# Patient Record
Sex: Female | Born: 1990
Health system: Southern US, Community
[De-identification: ages and names within clinical notes are randomized; demographics above are authoritative.]

## PROBLEM LIST (undated history)

## (undated) DIAGNOSIS — I493 Ventricular premature depolarization: Secondary | ICD-10-CM

## (undated) DIAGNOSIS — Z87898 Personal history of other specified conditions: Secondary | ICD-10-CM

## (undated) DIAGNOSIS — J45909 Unspecified asthma, uncomplicated: Secondary | ICD-10-CM

## (undated) DIAGNOSIS — I499 Cardiac arrhythmia, unspecified: Secondary | ICD-10-CM

## (undated) DIAGNOSIS — G43909 Migraine, unspecified, not intractable, without status migrainosus: Secondary | ICD-10-CM

## (undated) HISTORY — DX: Ventricular premature depolarization: I49.3

## (undated) HISTORY — DX: Personal history of other specified conditions: Z87.898

## (undated) HISTORY — PX: INDUCED ABORTION: SHX677

## (undated) HISTORY — DX: Unspecified asthma, uncomplicated: J45.909

## (undated) HISTORY — DX: Migraine, unspecified, not intractable, without status migrainosus: G43.909

## (undated) HISTORY — PX: LYMPHADENECTOMY: SHX15

## (undated) HISTORY — DX: Cardiac arrhythmia, unspecified: I49.9

---
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
## 2009-02-10 ENCOUNTER — Emergency Department (HOSPITAL_COMMUNITY): Admission: EM | Admit: 2009-02-10 | Discharge: 2009-02-10 | Payer: Self-pay | Admitting: Emergency Medicine

## 2010-11-28 LAB — URINALYSIS, ROUTINE W REFLEX MICROSCOPIC
Bilirubin Urine: NEGATIVE
Glucose, UA: NEGATIVE
Ketones, ur: NEGATIVE
Nitrite: NEGATIVE
Protein, ur: NEGATIVE
pH: 6

## 2010-11-28 LAB — URINE MICROSCOPIC-ADD ON

## 2010-11-28 LAB — PREGNANCY, URINE

## 2011-07-25 ENCOUNTER — Encounter: Payer: Self-pay | Admitting: *Deleted

## 2011-07-25 DIAGNOSIS — J45909 Unspecified asthma, uncomplicated: Secondary | ICD-10-CM | POA: Insufficient documentation

## 2011-07-25 DIAGNOSIS — Z87898 Personal history of other specified conditions: Secondary | ICD-10-CM | POA: Insufficient documentation

## 2012-08-02 ENCOUNTER — Emergency Department (INDEPENDENT_AMBULATORY_CARE_PROVIDER_SITE_OTHER): Payer: 59

## 2012-08-02 ENCOUNTER — Emergency Department (HOSPITAL_COMMUNITY)
Admission: EM | Admit: 2012-08-02 | Discharge: 2012-08-02 | Disposition: A | Discharge status: Home / Self Care | Payer: 59 | Source: Home / Self Care | Attending: Emergency Medicine | Admitting: Emergency Medicine

## 2012-08-02 ENCOUNTER — Encounter (HOSPITAL_COMMUNITY): Payer: Self-pay | Admitting: Emergency Medicine

## 2012-08-02 DIAGNOSIS — S335XXA Sprain of ligaments of lumbar spine, initial encounter: Secondary | ICD-10-CM

## 2012-08-02 DIAGNOSIS — S39012A Strain of muscle, fascia and tendon of lower back, initial encounter: Secondary | ICD-10-CM

## 2012-08-02 LAB — POCT URINALYSIS DIP (DEVICE)
Bilirubin Urine: NEGATIVE
Ketones, ur: NEGATIVE mg/dL
Leukocytes, UA: NEGATIVE
Specific Gravity, Urine: >=1.03 (ref 1.005–1.030)

## 2012-08-02 LAB — POCT PREGNANCY, URINE: Preg Test, Ur: NEGATIVE

## 2012-08-02 MED ORDER — METHOCARBAMOL 500 MG PO TABS: 500.0000 mg | ORAL_TABLET | Freq: Three times a day (TID) | ORAL | Status: DC

## 2012-08-02 MED ORDER — TRAMADOL HCL 50 MG PO TABS: 100.0000 mg | ORAL_TABLET | Freq: Three times a day (TID) | ORAL | Status: DC | PRN

## 2012-08-02 MED ORDER — KETOROLAC TROMETHAMINE 60 MG/2ML IM SOLN
60.0000 mg | Freq: Once | INTRAMUSCULAR | Status: AC
  Administered 2012-08-02: 60 mg via INTRAMUSCULAR

## 2012-08-02 MED ORDER — MELOXICAM 15 MG PO TABS: 15.0000 mg | ORAL_TABLET | Freq: Every day | ORAL | Status: DC

## 2012-08-02 MED ORDER — KETOROLAC TROMETHAMINE 60 MG/2ML IM SOLN
INTRAMUSCULAR | Status: AC
  Filled 2012-08-02: qty 2
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
## 2014-05-21 ENCOUNTER — Telehealth: Payer: Self-pay

## 2014-05-21 ENCOUNTER — Other Ambulatory Visit: Payer: Self-pay

## 2014-05-21 DIAGNOSIS — R000 Tachycardia, unspecified: Secondary | ICD-10-CM

## 2014-05-21 NOTE — Telephone Encounter (Signed)
Patient called no answer.Left message on personal voice mail appointment scheduled with Dr.Jordan 06/18/14 at 4:15 pm.

## 2014-05-27 ENCOUNTER — Ambulatory Visit (HOSPITAL_COMMUNITY)
Admission: RE | Admit: 2014-05-27 | Discharge: 2014-05-27 | Disposition: A | Discharge status: Home / Self Care | Payer: BLUE CROSS/BLUE SHIELD | Source: Ambulatory Visit | Attending: Cardiovascular Disease | Admitting: Cardiovascular Disease

## 2014-05-27 DIAGNOSIS — Z8249 Family history of ischemic heart disease and other diseases of the circulatory system: Secondary | ICD-10-CM | POA: Diagnosis not present

## 2014-05-27 DIAGNOSIS — R000 Tachycardia, unspecified: Secondary | ICD-10-CM | POA: Insufficient documentation

## 2014-05-27 DIAGNOSIS — Z87898 Personal history of other specified conditions: Secondary | ICD-10-CM

## 2014-05-27 NOTE — Progress Notes (Signed)
2D Echocardiogram Complete.  05/27/2014   Patricia Rivera, [place]

## 2014-06-01 ENCOUNTER — Telehealth: Payer: Self-pay | Admitting: Cardiology

## 2014-06-01 NOTE — Telephone Encounter (Signed)
Received records from [facility] for appointment on 06/18/14 with Dr Martinique.  Records given to [facility] (medical records) for Dr Doug Sou schedule on 06/18/14. lp

## 2014-06-18 ENCOUNTER — Ambulatory Visit (INDEPENDENT_AMBULATORY_CARE_PROVIDER_SITE_OTHER): Payer: BLUE CROSS/BLUE SHIELD | Admitting: Cardiology

## 2014-06-18 ENCOUNTER — Encounter: Payer: Self-pay | Admitting: Cardiology

## 2014-06-18 VITALS — BP 130/90 | HR 98 | Ht 66.0 in | Wt 145.0 lb

## 2014-06-18 DIAGNOSIS — Z87898 Personal history of other specified conditions: Secondary | ICD-10-CM

## 2014-06-18 DIAGNOSIS — R002 Palpitations: Secondary | ICD-10-CM

## 2014-06-18 NOTE — Progress Notes (Signed)
Cardiology Office Note   Date:  06/18/2014   ID:  Patricia Rivera, DOB 03-Sep-1990, MRN [id number]  PCP:  Osborne Casco, MD  Cardiologist:   Parminder Trapani Martinique, MD   Chief Complaint  Patient presents with  . Tachycardia      History of Present Illness: Patricia Rivera is a 24 y.o. female who presents for evaluation of tachycardia. I saw her as a 24 yo for atypical chest pain as a 24 yo. Her cardiac exam and Ecg were normal and she was reassured. She still complains of some intermittent aching in her chest and one night awoke with a sensation of a "Charlie horse" in her left arm. More recently she has noted episodes when her HR is going fast- sometimes up to 130-140. This occurs almost every day and is random without clear triggers. It seems to gradually increase then decrease slowly. Lasts as long as 30 minutes. She wears a Fit bit and notes her HR will increase at work to 120-130. No dizziness or syncope. No dyspnea. Otherwise in good health. She did have panic attacks when her BCP was changed and she went back to her original Rx.     Past Medical History  Diagnosis Date  . H/O chest pain   . Childhood asthma     Past Surgical History  Procedure Laterality Date  . Other surgical history  2010    lymph node removal  . Lymphadenectomy       No current outpatient prescriptions on file.   No current facility-administered medications for this visit.    Allergies:   Review of patient's allergies indicates no known allergies.    Social History:  The patient  reports that she has been passively smoking.  She does not have any smokeless tobacco history on file. She reports that she drinks alcohol. She reports that she does not use illicit drugs.   Family History:  The patient's family history includes Heart attack in her mother.    ROS:  Please see the history of present illness.   Otherwise, review of systems are positive for none.   All other systems are reviewed and negative.     PHYSICAL EXAM: VS:  BP 130/90 mmHg  Pulse 98  Ht 5\' 6"  (1.676 m)  Wt 145 lb (65.772 kg)  BMI 23.41 kg/m2 , BMI Body mass index is 23.41 kg/(m^2). GEN: Young, Well nourished, well developed, in no acute distress HEENT: normal Neck: no JVD, carotid bruits, or masses Cardiac: RRR; no murmurs, rubs, or gallops,no edema  Respiratory:  clear to auscultation bilaterally, normal work of breathing GI: soft, nontender, nondistended, + BS MS: no deformity or atrophy Skin: warm and dry, no rash Neuro:  Strength and sensation are intact Psych: euthymic mood, full affect   EKG:  EKG is ordered today. The ekg ordered today demonstrates NSR with short PR of 104 msec. Otherwise normal. I have personally reviewed and interpreted this study.    Recent Labs: No results found for requested labs within last 365 days.    Lipid Panel No results found for: CHOL, TRIG, HDL, CHOLHDL, VLDL, LDLCALC, LDLDIRECT    Wt Readings from Last 3 Encounters:  06/18/14 145 lb (65.772 kg)      Other studies Reviewed: Additional studies/ records that were reviewed today include: reports from Dr. Laurann Montana. Review of the above records demonstrates: Normal Ecg, CBC, and TSH   ASSESSMENT AND PLAN:  1.  Tachycardia-need to rule out primary arrhythmia. I suspect this  is just sinus tachycardia. Will have her wear a Holter monitor to document. 2. Atypical chest pain. Normal Ecg and exam. No further work up needed.    Current medicines are reviewed at length with the patient today.  The patient does not have concerns regarding medicines.  The following changes have been made:  no change  Labs/ tests ordered today include:  Orders Placed This Encounter  Procedures  . Holter monitor - 48 hour  . EKG 12-Lead     Disposition:   FU with to be determined.  Signed, Imunique Samad Martinique, [place] 06/18/2014 5:37 PM    [facility] [address], [place], [place], [address] Phone [phone],  Fax [phone]

## 2014-06-18 NOTE — Patient Instructions (Signed)
We will have you wear a Holter monitor for 48 hours.

## 2014-06-24 ENCOUNTER — Ambulatory Visit: Payer: Self-pay | Admitting: Internal Medicine

## 2016-10-16 LAB — OB RESULTS CONSOLE RPR: RPR: NONREACTIVE

## 2016-10-16 LAB — OB RESULTS CONSOLE GC/CHLAMYDIA
Chlamydia: NEGATIVE
GC PROBE AMP, GENITAL: NEGATIVE

## 2016-10-16 LAB — OB RESULTS CONSOLE ANTIBODY SCREEN: Antibody Screen: NEGATIVE

## 2016-10-16 LAB — OB RESULTS CONSOLE RUBELLA ANTIBODY, IGM: Rubella: IMMUNE

## 2016-10-16 LAB — OB RESULTS CONSOLE HEPATITIS B SURFACE ANTIGEN: Hepatitis B Surface Ag: NEGATIVE

## 2016-10-16 LAB — OB RESULTS CONSOLE HIV ANTIBODY (ROUTINE TESTING): HIV: NONREACTIVE

## 2016-10-16 LAB — OB RESULTS CONSOLE ABO/RH: RH TYPE: POSITIVE

## 2017-03-06 ENCOUNTER — Encounter: Payer: Self-pay | Admitting: *Deleted

## 2017-03-07 ENCOUNTER — Encounter: Payer: Self-pay | Admitting: Pulmonary Disease

## 2017-03-07 ENCOUNTER — Ambulatory Visit: Payer: 59 | Admitting: Pulmonary Disease

## 2017-03-07 ENCOUNTER — Other Ambulatory Visit: Payer: 59

## 2017-03-07 ENCOUNTER — Ambulatory Visit (INDEPENDENT_AMBULATORY_CARE_PROVIDER_SITE_OTHER)
Admission: RE | Admit: 2017-03-07 | Discharge: 2017-03-07 | Disposition: A | Discharge status: Home / Self Care | Payer: 59 | Source: Ambulatory Visit | Attending: Pulmonary Disease | Admitting: Pulmonary Disease

## 2017-03-07 VITALS — BP 124/80 | HR 100 | Ht 66.0 in | Wt 175.4 lb

## 2017-03-07 DIAGNOSIS — R059 Cough, unspecified: Secondary | ICD-10-CM

## 2017-03-07 DIAGNOSIS — R05 Cough: Secondary | ICD-10-CM | POA: Diagnosis not present

## 2017-03-07 LAB — NITRIC OXIDE: NITRIC OXIDE: 21

## 2017-03-07 MED ORDER — ALBUTEROL SULFATE HFA 108 (90 BASE) MCG/ACT IN AERS: 2.0000 | INHALATION_SPRAY | Freq: Four times a day (QID) | RESPIRATORY_TRACT | 2 refills | Status: DC | PRN

## 2017-03-07 NOTE — Patient Instructions (Signed)
We will check a chest x-ray today Check CBC differential, blood allergy profile We will restart you on albuterol inhaler Continue to use Tums for acid reflux You can use Benadryl and Flonase for reduction of postnasal drip.  Please check with your OB/GYN doctor if Benadryl is okay to use Follow-up in 3 months with pulmonary function test.

## 2017-03-07 NOTE — Progress Notes (Signed)
Patricia Rivera    [id number]    December 25, 1990  Primary Care Physician:Griffin, Margaretha Sheffield, MD  Referring Physician: Kelton Pillar, MD [place]. [employer] [address] [place], [place] [address]  Chief complaint: Consult for cough  HPI: 27 year old with history of childhood asthma.  Complains of chronic cough.  Started in August 2018.  She is given an inhaler by primary care at that time which improved symptoms.  This has recurred over the past few months.  Has complains of upper respiratory tract infection with cough, mucus and reports specks of blood in her sputum. Treated with a course of antibiotics by primary care with improvement in symptoms She is currently [redacted] weeks pregnant. She reports seasonal allergies, acid reflux which is worsened by pregnancy.  She is taking Tums for GERD.  Pets: 3 cats.  Feels that she may be allergic to cats.  No birds Occupation: Art therapist Exposures: She have been exposed to mold in the crawl space in 2017.  No recent exposure.  She has a hot tub at home which is used very rarely Smoking history: Social smoking Travel History: Not significant  Outpatient Encounter Medications as of 03/07/2017  Medication Sig  . amoxicillin (AMOXIL) 500 MG capsule    No facility-administered encounter medications on file as of 03/07/2017.     Allergies as of 03/07/2017  . (No Known Allergies)    Past Medical History:  Diagnosis Date  . Childhood asthma   . H/O chest pain   . Migraine     Past Surgical History:  Procedure Laterality Date  . INDUCED ABORTION     by dilation and evacuation  . LYMPHADENECTOMY    . OTHER SURGICAL HISTORY  2010   lymph node removal    Family History  Problem Relation Age of Onset  . Heart attack Mother   . Osteoporosis Mother   . Hypertension Mother   . Diabetes Maternal Grandmother   . Hypertension Maternal Grandmother   . Cerebrovascular Accident Maternal Grandfather     Social History   Socioeconomic  History  . Marital status: Married    Spouse name: Not on file  . Number of children: Not on file  . Years of education: Not on file  . Highest education level: Not on file  Social Needs  . Financial resource strain: Not on file  . Food insecurity - worry: Not on file  . Food insecurity - inability: Not on file  . Transportation needs - medical: Not on file  . Transportation needs - non-medical: Not on file  Occupational History  . Occupation: dental asst  Tobacco Use  . Smoking status: Passive Smoke Exposure - Never Smoker  . Smokeless tobacco: Never Used  Substance and Sexual Activity  . Alcohol use: No    Frequency: Never  . Drug use: No  . Sexual activity: Yes    Birth control/protection: Pill  Other Topics Concern  . Not on file  Social History Narrative  . Not on file   Review of systems: Review of Systems  Constitutional: Negative for fever and chills.  HENT: Negative.   Eyes: Negative for blurred vision.  Respiratory: as per HPI  Cardiovascular: Negative for chest pain and palpitations.  Gastrointestinal: Negative for vomiting, diarrhea, blood per rectum. Genitourinary: Negative for dysuria, urgency, frequency and hematuria.  Musculoskeletal: Negative for myalgias, back pain and joint pain.  Skin: Negative for itching and rash.  Neurological: Negative for dizziness, tremors, focal weakness,  seizures and loss of consciousness.  Endo/Heme/Allergies: Negative for environmental allergies.  Psychiatric/Behavioral: Negative for depression, suicidal ideas and hallucinations.  All other systems reviewed and are negative.  Physical Exam: Blood pressure 124/80, pulse 100, height 5\' 6"  (1.676 m), weight 175 lb 6.4 oz (79.6 kg), SpO2 98 %. Gen:      No acute distress HEENT:  EOMI, sclera anicteric Neck:     No masses; no thyromegaly Lungs:    Clear to auscultation bilaterally; normal respiratory effort CV:         Regular rate and rhythm; no murmurs Abd:      + bowel  sounds; soft, non-tender; no palpable masses, no distension Ext:    No edema; adequate peripheral perfusion Skin:      Warm and dry; no rash Neuro: alert and oriented x 3 Psych: normal mood and affect  Data Reviewed: FENO 03/07/17-21  Assessment:  Evaluation for chronic cough Suspect upper airway cough syndrome from postnasal drip, GERD, asthmatic bronchitis Chest x-ray today to make sure there is no lung infiltrate.  Restart albuterol inhaler CBC differential, blood allergy profile. Recommended to continue Tums for acid reflux.  She will use Benadryl and Flonase over-the-counter for postnasal drip. I have advised to clear these with her OB/GYN before starting.  Plan/Recommendations: - CBC differential, blood allergy profile - Chest x-ray - Albuterol as needed - Tums for acid reflux, antihistamine and flonase nasal spray  Marshell Garfinkel MD [facility] Pager [phone] 03/07/2017, 11:35 AM  CC: Kelton Pillar, MD

## 2017-03-08 ENCOUNTER — Telehealth: Payer: Self-pay | Admitting: Pulmonary Disease

## 2017-03-08 NOTE — Telephone Encounter (Signed)
Notes recorded by Marshell Garfinkel, MD on 03/08/2017 at 6:05 AM EST Please let the patient know the CXR is normal  Spoke with patient. She is aware of results. Also advised patient to disregard Margie's VM since she had called the patient at the same time for her results. She verbalized understanding. Nothing else needed at time of call.

## 2017-04-29 LAB — OB RESULTS CONSOLE GBS: STREP GROUP B AG: NEGATIVE

## 2017-05-13 ENCOUNTER — Encounter (HOSPITAL_COMMUNITY): Payer: Self-pay

## 2017-05-13 ENCOUNTER — Other Ambulatory Visit: Payer: Self-pay | Admitting: Obstetrics and Gynecology

## 2017-05-14 NOTE — Patient Instructions (Signed)
Patricia Rivera  05/14/2017   Your procedure is scheduled on:  05/16/2017  Enter through the Main Entrance of [facility] at 3:45 PM.  Pick up the phone at the desk and dial [phone]  Call this number if you have problems the morning of surgery:[phone]  Remember:   Do not eat food:(After Midnight) Desps de medianoche.  Do not drink clear liquids: (6 Hours before arrival) 6 horas ante llegada.  Take these medicines the morning of surgery with A SIP OF WATER: please bring your inhaler and you may take your zantac as directed   Do not wear jewelry, make-up or nail polish.  Do not wear lotions, powders, or perfumes. Do not wear deodorant.  Do not shave 48 hours prior to surgery.  Do not bring valuables to the hospital.  [facility] is not   responsible for any belongings or valuables brought to the hospital.  Contacts, dentures or bridgework may not be worn into surgery.  Leave suitcase in the car. After surgery it may be brought to your room.  For patients admitted to the hospital, checkout time is 11:00 AM the day of              discharge.    N/A   Please read over the following fact sheets that you were given:   Surgical Site Infection Prevention

## 2017-05-15 ENCOUNTER — Encounter (HOSPITAL_COMMUNITY)
Admission: RE | Admit: 2017-05-15 | Discharge: 2017-05-15 | Disposition: A | Discharge status: Home / Self Care | Payer: 59 | Source: Ambulatory Visit | Attending: Obstetrics and Gynecology | Admitting: Obstetrics and Gynecology

## 2017-05-15 LAB — CBC
HEMATOCRIT: 38.7 % (ref 36.0–46.0)
HEMOGLOBIN: 12.8 g/dL (ref 12.0–15.0)
MCH: 29.8 pg (ref 26.0–34.0)
MCHC: 33.1 g/dL (ref 30.0–36.0)
MCV: 90.2 fL (ref 78.0–100.0)
Platelets: 272 10*3/uL (ref 150–400)
RBC: 4.29 MIL/uL (ref 3.87–5.11)
RDW: 14.1 % (ref 11.5–15.5)
WBC: 10 10*3/uL (ref 4.0–10.5)

## 2017-05-15 LAB — TYPE AND SCREEN
ABO/RH(D): O POS
ANTIBODY SCREEN: NEGATIVE

## 2017-05-15 LAB — ABO/RH: ABO/RH(D): O POS

## 2017-05-16 ENCOUNTER — Inpatient Hospital Stay (HOSPITAL_COMMUNITY)
Admission: AD | Admit: 2017-05-16 | Discharge: 2017-05-19 | DRG: 787 | Disposition: A | Discharge status: Home / Self Care | Payer: 59 | Source: Ambulatory Visit | Attending: Obstetrics and Gynecology | Admitting: Obstetrics and Gynecology

## 2017-05-16 ENCOUNTER — Inpatient Hospital Stay (HOSPITAL_COMMUNITY): Payer: 59 | Admitting: Anesthesiology

## 2017-05-16 ENCOUNTER — Encounter (HOSPITAL_COMMUNITY)
Admission: AD | Disposition: A | Discharge status: Home / Self Care | Payer: Self-pay | Source: Ambulatory Visit | Attending: Obstetrics and Gynecology

## 2017-05-16 ENCOUNTER — Encounter (HOSPITAL_COMMUNITY): Payer: Self-pay | Admitting: *Deleted

## 2017-05-16 DIAGNOSIS — D259 Leiomyoma of uterus, unspecified: Secondary | ICD-10-CM | POA: Diagnosis present

## 2017-05-16 DIAGNOSIS — O321XX0 Maternal care for breech presentation, not applicable or unspecified: Secondary | ICD-10-CM | POA: Diagnosis present

## 2017-05-16 DIAGNOSIS — O9081 Anemia of the puerperium: Secondary | ICD-10-CM | POA: Diagnosis not present

## 2017-05-16 DIAGNOSIS — D62 Acute posthemorrhagic anemia: Secondary | ICD-10-CM | POA: Diagnosis not present

## 2017-05-16 DIAGNOSIS — O3413 Maternal care for benign tumor of corpus uteri, third trimester: Secondary | ICD-10-CM | POA: Diagnosis present

## 2017-05-16 DIAGNOSIS — Z3A39 39 weeks gestation of pregnancy: Secondary | ICD-10-CM | POA: Diagnosis not present

## 2017-05-16 LAB — RPR: RPR: NONREACTIVE

## 2017-05-16 SURGERY — Surgical Case
Anesthesia: Spinal

## 2017-05-16 MED ORDER — NALBUPHINE HCL 10 MG/ML IJ SOLN
5.0000 mg | Freq: Once | INTRAMUSCULAR | Status: AC
  Administered 2017-05-16: 5 mg via INTRAVENOUS

## 2017-05-16 MED ORDER — PRENATAL MULTIVITAMIN CH
1.0000 | ORAL_TABLET | Freq: Every day | ORAL | Status: DC
  Administered 2017-05-17 – 2017-05-19 (×3): 1 via ORAL
  Filled 2017-05-16 (×3): qty 1

## 2017-05-16 MED ORDER — NALBUPHINE HCL 10 MG/ML IJ SOLN: 5.0000 mg | INTRAMUSCULAR | Status: DC | PRN

## 2017-05-16 MED ORDER — SIMETHICONE 80 MG PO CHEW
80.0000 mg | CHEWABLE_TABLET | ORAL | Status: DC
  Administered 2017-05-16 – 2017-05-18 (×3): 80 mg via ORAL
  Filled 2017-05-16 (×3): qty 1

## 2017-05-16 MED ORDER — DIBUCAINE 1 % RE OINT: 1.0000 "application " | TOPICAL_OINTMENT | RECTAL | Status: DC | PRN

## 2017-05-16 MED ORDER — METHYLERGONOVINE MALEATE 0.2 MG PO TABS: 0.2000 mg | ORAL_TABLET | ORAL | Status: DC | PRN

## 2017-05-16 MED ORDER — MENTHOL 3 MG MT LOZG: 1.0000 | LOZENGE | OROMUCOSAL | Status: DC | PRN

## 2017-05-16 MED ORDER — NALBUPHINE HCL 10 MG/ML IJ SOLN
5.0000 mg | Freq: Once | INTRAMUSCULAR | Status: DC | PRN
Start: 2017-05-16 — End: 2017-05-19

## 2017-05-16 MED ORDER — PHENYLEPHRINE 40 MCG/ML (10ML) SYRINGE FOR IV PUSH (FOR BLOOD PRESSURE SUPPORT)
PREFILLED_SYRINGE | INTRAVENOUS | Status: DC | PRN
Start: 2017-05-16 — End: 2017-05-16
  Administered 2017-05-16: 60 ug via INTRAVENOUS
  Administered 2017-05-16: 80 ug via INTRAVENOUS

## 2017-05-16 MED ORDER — ONDANSETRON HCL 4 MG/2ML IJ SOLN: 4.0000 mg | Freq: Three times a day (TID) | INTRAMUSCULAR | Status: DC | PRN

## 2017-05-16 MED ORDER — PHENYLEPHRINE 8 MG IN D5W 100 ML (0.08MG/ML) PREMIX OPTIME
INJECTION | INTRAVENOUS | Status: AC
  Filled 2017-05-16: qty 100

## 2017-05-16 MED ORDER — NALBUPHINE HCL 10 MG/ML IJ SOLN
INTRAMUSCULAR | Status: AC
  Filled 2017-05-16: qty 1

## 2017-05-16 MED ORDER — ACETAMINOPHEN 325 MG PO TABS
650.0000 mg | ORAL_TABLET | ORAL | Status: DC | PRN
  Administered 2017-05-17 – 2017-05-19 (×6): 650 mg via ORAL
  Filled 2017-05-16 (×7): qty 2

## 2017-05-16 MED ORDER — ONDANSETRON HCL 4 MG/2ML IJ SOLN
INTRAMUSCULAR | Status: AC
  Filled 2017-05-16: qty 2

## 2017-05-16 MED ORDER — IBUPROFEN 600 MG PO TABS
600.0000 mg | ORAL_TABLET | Freq: Four times a day (QID) | ORAL | Status: DC
  Administered 2017-05-16 – 2017-05-19 (×11): 600 mg via ORAL
  Filled 2017-05-16 (×11): qty 1

## 2017-05-16 MED ORDER — LACTATED RINGERS IV SOLN
INTRAVENOUS | Status: DC
  Administered 2017-05-16 (×3): via INTRAVENOUS

## 2017-05-16 MED ORDER — BUPIVACAINE HCL (PF) 0.25 % IJ SOLN
INTRAMUSCULAR | Status: AC
  Filled 2017-05-16: qty 30

## 2017-05-16 MED ORDER — DEXAMETHASONE SODIUM PHOSPHATE 10 MG/ML IJ SOLN
INTRAMUSCULAR | Status: AC
  Filled 2017-05-16: qty 1

## 2017-05-16 MED ORDER — FENTANYL CITRATE (PF) 100 MCG/2ML IJ SOLN
INTRAMUSCULAR | Status: AC
  Filled 2017-05-16: qty 2

## 2017-05-16 MED ORDER — SCOPOLAMINE 1 MG/3DAYS TD PT72: 1.0000 | MEDICATED_PATCH | Freq: Once | TRANSDERMAL | Status: DC

## 2017-05-16 MED ORDER — COCONUT OIL OIL
1.0000 "application " | TOPICAL_OIL | Status: DC | PRN
  Filled 2017-05-16: qty 120

## 2017-05-16 MED ORDER — WITCH HAZEL-GLYCERIN EX PADS: 1.0000 "application " | MEDICATED_PAD | CUTANEOUS | Status: DC | PRN

## 2017-05-16 MED ORDER — METHYLERGONOVINE MALEATE 0.2 MG/ML IJ SOLN: 0.2000 mg | INTRAMUSCULAR | Status: DC | PRN

## 2017-05-16 MED ORDER — FENTANYL CITRATE (PF) 100 MCG/2ML IJ SOLN: 25.0000 ug | INTRAMUSCULAR | Status: DC | PRN

## 2017-05-16 MED ORDER — OXYTOCIN 40 UNITS IN LACTATED RINGERS INFUSION - SIMPLE MED: 2.5000 [IU]/h | INTRAVENOUS | Status: AC

## 2017-05-16 MED ORDER — MEPERIDINE HCL 25 MG/ML IJ SOLN: 6.2500 mg | INTRAMUSCULAR | Status: DC | PRN

## 2017-05-16 MED ORDER — SIMETHICONE 80 MG PO CHEW: 80.0000 mg | CHEWABLE_TABLET | ORAL | Status: DC | PRN

## 2017-05-16 MED ORDER — ACETAMINOPHEN 160 MG/5ML PO SOLN
1000.0000 mg | Freq: Once | ORAL | Status: AC
  Administered 2017-05-16: 1000 mg via ORAL
  Filled 2017-05-16: qty 40.6

## 2017-05-16 MED ORDER — DIPHENHYDRAMINE HCL 50 MG/ML IJ SOLN: 12.5000 mg | INTRAMUSCULAR | Status: DC | PRN

## 2017-05-16 MED ORDER — MORPHINE SULFATE (PF) 0.5 MG/ML IJ SOLN
INTRAMUSCULAR | Status: AC
  Filled 2017-05-16: qty 10

## 2017-05-16 MED ORDER — ONDANSETRON HCL 4 MG/2ML IJ SOLN
INTRAMUSCULAR | Status: DC | PRN
  Administered 2017-05-16: 4 mg via INTRAVENOUS

## 2017-05-16 MED ORDER — SENNOSIDES-DOCUSATE SODIUM 8.6-50 MG PO TABS
2.0000 | ORAL_TABLET | ORAL | Status: DC
  Administered 2017-05-16 – 2017-05-18 (×3): 2 via ORAL
  Filled 2017-05-16 (×3): qty 2

## 2017-05-16 MED ORDER — PHENYLEPHRINE 8 MG IN D5W 100 ML (0.08MG/ML) PREMIX OPTIME
INJECTION | INTRAVENOUS | Status: DC | PRN
  Administered 2017-05-16: 60 ug/min via INTRAVENOUS

## 2017-05-16 MED ORDER — LACTATED RINGERS IV SOLN
INTRAVENOUS | Status: DC
  Administered 2017-05-17: 08:00:00 via INTRAVENOUS

## 2017-05-16 MED ORDER — SODIUM CHLORIDE 0.9% FLUSH: 3.0000 mL | INTRAVENOUS | Status: DC | PRN

## 2017-05-16 MED ORDER — OXYTOCIN 10 UNIT/ML IJ SOLN
INTRAMUSCULAR | Status: AC
  Filled 2017-05-16: qty 4

## 2017-05-16 MED ORDER — ZOLPIDEM TARTRATE 5 MG PO TABS: 5.0000 mg | ORAL_TABLET | Freq: Every evening | ORAL | Status: DC | PRN

## 2017-05-16 MED ORDER — SIMETHICONE 80 MG PO CHEW
80.0000 mg | CHEWABLE_TABLET | Freq: Three times a day (TID) | ORAL | Status: DC
  Administered 2017-05-17 – 2017-05-19 (×6): 80 mg via ORAL
  Filled 2017-05-16 (×7): qty 1

## 2017-05-16 MED ORDER — TETANUS-DIPHTH-ACELL PERTUSSIS 5-2.5-18.5 LF-MCG/0.5 IM SUSP: 0.5000 mL | Freq: Once | INTRAMUSCULAR | Status: DC

## 2017-05-16 MED ORDER — NALOXONE HCL 4 MG/10ML IJ SOLN: 1.0000 ug/kg/h | INTRAVENOUS | Status: DC | PRN

## 2017-05-16 MED ORDER — DEXAMETHASONE SODIUM PHOSPHATE 10 MG/ML IJ SOLN
INTRAMUSCULAR | Status: DC | PRN
  Administered 2017-05-16: 10 mg via INTRAVENOUS

## 2017-05-16 MED ORDER — OXYTOCIN 10 UNIT/ML IJ SOLN
INTRAVENOUS | Status: DC | PRN
  Administered 2017-05-16: 40 [IU] via INTRAVENOUS

## 2017-05-16 MED ORDER — PHENYLEPHRINE 40 MCG/ML (10ML) SYRINGE FOR IV PUSH (FOR BLOOD PRESSURE SUPPORT)
PREFILLED_SYRINGE | INTRAVENOUS | Status: AC
Start: 2017-05-16 — End: 2017-05-16
  Filled 2017-05-16: qty 10

## 2017-05-16 MED ORDER — CEFAZOLIN SODIUM-DEXTROSE 2-4 GM/100ML-% IV SOLN
2.0000 g | INTRAVENOUS | Status: AC
  Administered 2017-05-16: 2 g via INTRAVENOUS

## 2017-05-16 MED ORDER — NALOXONE HCL 0.4 MG/ML IJ SOLN: 0.4000 mg | INTRAMUSCULAR | Status: DC | PRN

## 2017-05-16 MED ORDER — MORPHINE SULFATE (PF) 0.5 MG/ML IJ SOLN
INTRAMUSCULAR | Status: DC | PRN
  Administered 2017-05-16: .1 mg via EPIDURAL

## 2017-05-16 MED ORDER — NALBUPHINE HCL 10 MG/ML IJ SOLN: 5.0000 mg | Freq: Once | INTRAMUSCULAR | Status: DC | PRN

## 2017-05-16 MED ORDER — DIPHENHYDRAMINE HCL 25 MG PO CAPS: 25.0000 mg | ORAL_CAPSULE | ORAL | Status: DC | PRN

## 2017-05-16 MED ORDER — DIPHENHYDRAMINE HCL 25 MG PO CAPS: 25.0000 mg | ORAL_CAPSULE | Freq: Four times a day (QID) | ORAL | Status: DC | PRN

## 2017-05-16 MED ORDER — FENTANYL CITRATE (PF) 100 MCG/2ML IJ SOLN
INTRAMUSCULAR | Status: DC | PRN
  Administered 2017-05-16: 25 ug via INTRAVENOUS

## 2017-05-16 SURGICAL SUPPLY — 33 items
CHLORAPREP W/TINT 26ML (MISCELLANEOUS) ×2 IMPLANT
CLAMP CORD UMBIL (MISCELLANEOUS) IMPLANT
CLOTH BEACON ORANGE TIMEOUT ST (SAFETY) ×2 IMPLANT
DERMABOND ADHESIVE PROPEN (GAUZE/BANDAGES/DRESSINGS) ×1
DERMABOND ADVANCED .7 DNX6 (GAUZE/BANDAGES/DRESSINGS) ×1 IMPLANT
DRSG OPSITE POSTOP 4X10 (GAUZE/BANDAGES/DRESSINGS) ×2 IMPLANT
ELECT REM PT RETURN 9FT ADLT (ELECTROSURGICAL) ×2
ELECTRODE REM PT RTRN 9FT ADLT (ELECTROSURGICAL) ×1 IMPLANT
EXTRACTOR VACUUM M CUP 4 TUBE (SUCTIONS) IMPLANT
GLOVE BIO SURGEON STRL SZ7.5 (GLOVE) ×2 IMPLANT
GLOVE BIOGEL PI IND STRL 7.0 (GLOVE) ×1 IMPLANT
GLOVE BIOGEL PI INDICATOR 7.0 (GLOVE) ×1
GOWN STRL REUS W/TWL LRG LVL3 (GOWN DISPOSABLE) ×4 IMPLANT
KIT ABG SYR 3ML LUER SLIP (SYRINGE) IMPLANT
NEEDLE HYPO 22GX1.5 SAFETY (NEEDLE) ×2 IMPLANT
NEEDLE HYPO 25X5/8 SAFETYGLIDE (NEEDLE) IMPLANT
NEEDLE SPNL 20GX3.5 QUINCKE YW (NEEDLE) IMPLANT
NS IRRIG 1000ML POUR BTL (IV SOLUTION) ×2 IMPLANT
PACK C SECTION WH (CUSTOM PROCEDURE TRAY) ×2 IMPLANT
PENCIL SMOKE EVAC W/HOLSTER (ELECTROSURGICAL) ×2 IMPLANT
SUT MNCRL 0 VIOLET CTX 36 (SUTURE) ×2 IMPLANT
SUT MNCRL AB 3-0 PS2 27 (SUTURE) IMPLANT
SUT MON AB 2-0 CT1 27 (SUTURE) ×2 IMPLANT
SUT MON AB-0 CT1 36 (SUTURE) ×4 IMPLANT
SUT MONOCRYL 0 CTX 36 (SUTURE) ×2
SUT PLAIN 0 NONE (SUTURE) IMPLANT
SUT PLAIN 2 0 (SUTURE)
SUT PLAIN 2 0 XLH (SUTURE) IMPLANT
SUT PLAIN ABS 2-0 CT1 27XMFL (SUTURE) IMPLANT
SYR 20CC LL (SYRINGE) IMPLANT
SYR CONTROL 10ML LL (SYRINGE) ×2 IMPLANT
TOWEL OR 17X24 6PK STRL BLUE (TOWEL DISPOSABLE) ×2 IMPLANT
TRAY FOLEY BAG SILVER LF 14FR (SET/KITS/TRAYS/PACK) ×2 IMPLANT

## 2017-05-16 NOTE — Progress Notes (Signed)
Patient ID: Patricia Rivera, female   DOB: [birth year], 27 y.o.   MRN: [id number] Patient seen and examined. Consent witnessed and signed. No changes noted. Update completed. BP 130/86   Pulse (!) 101   Temp 97.8 F (36.6 C) (Oral)   Resp 16   Ht 5\' 6"  (1.676 m)   Wt 81.6 kg (180 lb)   LMP 07/24/2016 (Approximate) Comment: pregnant  BMI 29.05 kg/m   CBC    Component Value Date/Time   WBC 10.0 05/15/2017 1110   RBC 4.29 05/15/2017 1110   HGB 12.8 05/15/2017 1110   HCT 38.7 05/15/2017 1110   PLT 272 05/15/2017 1110   MCV 90.2 05/15/2017 1110   MCH 29.8 05/15/2017 1110   MCHC 33.1 05/15/2017 1110   RDW 14.1 05/15/2017 1110

## 2017-05-16 NOTE — Anesthesia Procedure Notes (Signed)
Spinal  Patient location during procedure: OR Staffing Anesthesiologist: Lyndle Herrlich, MD Spinal Block Patient position: sitting Prep: DuraPrep Patient monitoring: heart rate, blood pressure and continuous pulse ox Approach: right paramedian Location: L3-4 Injection technique: single-shot Needle Needle type: Sprotte  Needle gauge: 24 G Needle length: 9 cm Assessment Sensory level: T3 Additional Notes Spinal Dosage in OR  .75% Bupivicaine ml       1.6     [id number]   mcg        100    Fentanyl mcg            25

## 2017-05-16 NOTE — Op Note (Signed)
Cesarean Section Procedure Note  Indications: malpresentation: frank breech  Pre-operative Diagnosis: 39 week 0 day pregnancy.  Post-operative Diagnosis: same  Surgeon: Lovenia Kim   Assistants: Janann Colonel, CNM  Anesthesia: Local anesthesia 0.25.% bupivacaine and Spinal anesthesia  ASA Class: 2  Procedure Details  The patient was seen in the Holding Room. The risks, benefits, complications, treatment options, and expected outcomes were discussed with the patient.  The patient concurred with the proposed plan, giving informed consent. The risks of anesthesia, infection, bleeding and possible injury to other organs discussed. Injury to bowel, bladder, or ureter with possible need for repair discussed. Possible need for transfusion with secondary risks of hepatitis or HIV acquisition discussed. Post operative complications to include but not limited to DVT, PE and Pneumonia noted. The site of surgery properly noted/marked. The patient was taken to Operating Room # 9, identified as Lively Lengyel and the procedure verified as C-Section Delivery. A Time Out was held and the above information confirmed.  After induction of anesthesia, the patient was draped and prepped in the usual sterile manner. A Pfannenstiel incision was made and carried down through the subcutaneous tissue to the fascia. Fascial incision was made and extended transversely using Mayo scissors. The fascia was separated from the underlying rectus tissue superiorly and inferiorly. The peritoneum was identified and entered. Peritoneal incision was extended longitudinally. The utero-vesical peritoneal reflection was incised transversely and the bladder flap was bluntly freed from the lower uterine segment. A low transverse uterine incision(Kerr hysterotomy) was made. Delivered from frank breech presentation was a  female with Apgar scores of 8 at one minute and 9 at five minutes. Bulb suctioning gently performed. Neonatal team in  attendance.After the umbilical cord was clamped and cut cord blood was obtained for evaluation. The placenta was removed intact and appeared normal. The uterus was curetted with a dry lap pack. Good hemostasis was noted.The uterine outline, tubes and ovaries appeared normal. The uterine incision was closed with running locked sutures of 0 Monocryl x 2 layers. Hemostasis was observed. The parietal peritoneum was closed with a running 2-0 Monocryl suture. The fascia was then reapproximated with running sutures of 0 Monocryl. The skin was reapproximated with 3-0 monocryl after [place] closure with 2-0 plain.  Instrument, sponge, and needle counts were correct prior the abdominal closure and at the conclusion of the case.   Findings: FTLM, frank breech. Post placenta. Nl uterus with small serosa fibroid  Estimated Blood Loss:  600         Drains: foley                 Specimens: placenta                 Complications:  None; patient tolerated the procedure well.         Disposition: PACU - hemodynamically stable.         Condition: stable  Attending Attestation: I performed the procedure.

## 2017-05-16 NOTE — H&P (Signed)
Patricia Rivera is a 27 y.o. female presenting for primary csection for breech at 39weeks. Declined ECV OB History    Gravida  1   Para      Term      Preterm      AB      Living        SAB      TAB      Ectopic      Multiple      Live Births             Past Medical History:  Diagnosis Date  . Childhood asthma   . H/O chest pain   . Migraine    Past Surgical History:  Procedure Laterality Date  . INDUCED ABORTION     by dilation and evacuation  . LYMPHADENECTOMY    . OTHER SURGICAL HISTORY  2010   lymph node removal   Family History: family history includes Cancer in her maternal grandfather; Cerebrovascular Accident in her maternal grandfather; Diabetes in her maternal grandmother; Heart attack in her mother; Hypertension in her maternal grandmother and mother; Osteoporosis in her mother. Social History:  reports that she is a non-smoker but has been exposed to tobacco smoke. She has never used smokeless tobacco. She reports that she does not drink alcohol or use drugs.     Maternal Diabetes: No Genetic Screening: Normal Maternal Ultrasounds/Referrals: Normal Fetal Ultrasounds or other Referrals:  None Maternal Substance Abuse:  No Significant Maternal Medications:  None Significant Maternal Lab Results:  None Other Comments:  None  Review of Systems  Constitutional: Negative.   All other systems reviewed and are negative.  Maternal Medical History:  Fetal activity: Perceived fetal activity is normal.   Last perceived fetal movement was within the past hour.    Prenatal complications: no prenatal complications Prenatal Complications - Diabetes: none.      Last menstrual period 07/24/2016. Maternal Exam:  Uterine Assessment: Contraction strength is mild.  Contraction frequency is rare.   Abdomen: Patient reports no abdominal tenderness. Fetal presentation: breech  Introitus: Normal vulva. Normal vagina.  Ferning test: not done.  Nitrazine  test: not done. Amniotic fluid character: not assessed.  Pelvis: questionable for delivery.   Cervix: Cervix evaluated by digital exam.     Physical Exam  Nursing note and vitals reviewed. Constitutional: She is oriented to person, place, and time. She appears well-developed and well-nourished.  HENT:  Head: Normocephalic and atraumatic.  Neck: Normal range of motion. Neck supple.  Cardiovascular: Normal rate and regular rhythm.  Respiratory: Effort normal and breath sounds normal.  GI: Soft. Bowel sounds are normal.  Genitourinary: Vagina normal and uterus normal.  Musculoskeletal: Normal range of motion.  Neurological: She is alert and oriented to person, place, and time. She has normal reflexes.  Skin: Skin is warm and dry.  Psychiatric: She has a normal mood and affect.    Prenatal labs: ABO, Rh: --/--/O POS, O POS Performed at [facility], [address] [place], [place] [address]  [id number]/03 1110) Antibody: NEG (04/03 1110) Rubella: Immune (09/04 0000) RPR: Non Reactive (04/03 1110)  HBsAg: Negative (09/04 0000)  HIV: Non-reactive (09/04 0000)  GBS: Negative (03/18 0000)   Assessment/Plan: 39 wk IUP Breech malpresentation Declined ECV Primary csection. Risks vs benefits discussed.  Consent done.   Sabino Denning J 05/16/2017, 8:48 AM

## 2017-05-16 NOTE — Anesthesia Preprocedure Evaluation (Signed)
Anesthesia Evaluation  Patient identified by MRN, date of birth, ID band Patient awake    Reviewed: Allergy & Precautions, H&P , Patient's Chart, lab work & pertinent test results  Airway Mallampati: II  TM Distance: >3 FB Neck ROM: full    Dental no notable dental hx.    Pulmonary    Pulmonary exam normal breath sounds clear to auscultation       Cardiovascular Exercise Tolerance: Good  Rhythm:regular Rate:Normal     Neuro/Psych    GI/Hepatic   Endo/Other    Renal/GU      Musculoskeletal   Abdominal   Peds  Hematology   Anesthesia Other Findings   Reproductive/Obstetrics                             Anesthesia Physical Anesthesia Plan  ASA: II  Anesthesia Plan: Spinal   Post-op Pain Management:    Induction:   PONV Risk Score and Plan:   Airway Management Planned:   Additional Equipment:   Intra-op Plan:   Post-operative Plan:   Informed Consent: I have reviewed the patients History and Physical, chart, labs and discussed the procedure including the risks, benefits and alternatives for the proposed anesthesia with the patient or authorized representative who has indicated his/her understanding and acceptance.     Plan Discussed with:   Anesthesia Plan Comments: (  )        Anesthesia Quick Evaluation

## 2017-05-16 NOTE — Transfer of Care (Signed)
Immediate Anesthesia Transfer of Care Note  Patient: Patricia Rivera  Procedure(s) Performed: Primary CESAREAN SECTION (N/A )  Patient Location: PACU  Anesthesia Type:Spinal  Level of Consciousness: awake  Airway & Oxygen Therapy: Patient Spontanous Breathing  Post-op Assessment: Report given to RN  Post vital signs: Reviewed and stable  Last Vitals:  Vitals Value Taken Time  BP    Temp    Pulse 109 05/16/2017  6:23 PM  Resp    SpO2 97 % 05/16/2017  6:23 PM  Vitals shown include unvalidated device data.  Last Pain:  Vitals:   05/16/17 1558  TempSrc: Oral         Complications: No apparent anesthesia complications

## 2017-05-17 ENCOUNTER — Encounter (HOSPITAL_COMMUNITY): Payer: Self-pay | Admitting: Obstetrics and Gynecology

## 2017-05-17 LAB — CBC
HEMATOCRIT: 34.7 % — AB (ref 36.0–46.0)
HEMOGLOBIN: 11.7 g/dL — AB (ref 12.0–15.0)
MCH: 29.9 pg (ref 26.0–34.0)
MCHC: 33.7 g/dL (ref 30.0–36.0)
MCV: 88.7 fL (ref 78.0–100.0)
Platelets: 262 10*3/uL (ref 150–400)
RBC: 3.91 MIL/uL (ref 3.87–5.11)
RDW: 13.7 % (ref 11.5–15.5)
WBC: 16.5 10*3/uL — ABNORMAL HIGH (ref 4.0–10.5)

## 2017-05-17 MED ORDER — OXYCODONE HCL 5 MG PO TABS
5.0000 mg | ORAL_TABLET | ORAL | Status: DC | PRN
  Administered 2017-05-18 – 2017-05-19 (×3): 5 mg via ORAL
  Filled 2017-05-17 (×3): qty 1

## 2017-05-17 MED ORDER — OXYCODONE HCL 5 MG PO TABS
10.0000 mg | ORAL_TABLET | ORAL | Status: DC | PRN
  Administered 2017-05-19: 10 mg via ORAL
  Filled 2017-05-17: qty 2

## 2017-05-17 NOTE — Progress Notes (Addendum)
No c/o; pain controlled,  No n/v - tol po; some ambulation; normal lochia; passing flatus Breastfeeding  Patient Vitals for the past 24 hrs:  BP Temp Temp src Pulse Resp SpO2 Height Weight  05/17/17 0750 100/62 98 F (36.7 C) Oral 72 16 98 % - -  05/17/17 0642 - - - - - 98 % - -  05/17/17 0547 - - - - - 98 % - -  05/17/17 0324 113/81 98 F (36.7 C) Oral 72 18 97 % - -  05/17/17 0150 - - - - - 97 % - -  05/16/17 2333 130/80 98.1 F (36.7 C) Oral 72 18 96 % - -  05/16/17 2234 125/86 98 F (36.7 C) Oral 82 18 98 % - -  05/16/17 2122 132/88 97.9 F (36.6 C) Oral 98 18 97 % - -  05/16/17 2020 124/84 98.1 F (36.7 C) Oral 88 18 98 % - -  05/16/17 2000 92/67 - - 76 16 99 % - -  05/16/17 1945 118/88 - - 80 18 99 % - -  05/16/17 1930 128/89 97.6 F (36.4 C) - 80 15 98 % - -  05/16/17 1915 123/86 - - 80 14 100 % - -  05/16/17 1900 123/87 - - 96 17 99 % - -  05/16/17 1845 127/76 - - 97 12 100 % - -  05/16/17 1840 - - - (!) 105 19 100 % - -  05/16/17 1830 125/77 - - (!) 106 15 100 % - -  05/16/17 1824 129/67 97.8 F (36.6 C) Oral (!) 110 20 - - -  05/16/17 1558 130/86 97.8 F (36.6 C) Oral (!) 101 16 - 5\' 6"  (1.676 m) 180 lb (81.6 kg)    Intake/Output Summary (Last 24 hours) at 05/17/2017 1218 Last data filed at 05/17/2017 1215 Gross per 24 hour  Intake 4668.75 ml  Output 3485 ml  Net 1183.75 ml     A&ox3 rrr ctab Abd: soft, nt, nd; fundus firm and 1cm below umb; dressing: c/d/i LE; tr edema, nt bilat  CBC Latest Ref Rng & Units 05/17/2017 05/15/2017  WBC 4.0 - 10.5 K/uL 16.5(H) 10.0  Hemoglobin 12.0 - 15.0 g/dL 11.7(L) 12.8  Hematocrit 36.0 - 46.0 % 34.7(L) 38.7  Platelets 150 - 400 K/uL 262 272   A/P: pod 1 s/p ltcs for breech 1. Doing well, contin current care 2. Acute anemia from blood loss, mild - asymptomatic, iron rich foods pp 3. Rubella immune 4. Rh positive

## 2017-05-17 NOTE — Plan of Care (Signed)
  Problem: Activity: Goal: Ability to tolerate increased activity will improve Note:  Discussed the importance of increasing activity today after IV fluids discontinued and after she gets lunch. Encouraged patient to attempt to void every 2-3 hours now that foley has been removed.

## 2017-05-17 NOTE — Anesthesia Postprocedure Evaluation (Signed)
Anesthesia Post Note  Patient: Patricia Rivera  Procedure(s) Performed: Primary CESAREAN SECTION (N/A )     Patient location during evaluation: Mother Baby Anesthesia Type: Spinal Level of consciousness: awake and alert Pain management: pain level controlled Vital Signs Assessment: post-procedure vital signs reviewed and stable Respiratory status: spontaneous breathing Cardiovascular status: blood pressure returned to baseline Postop Assessment: no headache, spinal receding and patient able to bend at knees Anesthetic complications: no Comments: Pain score 0.    Last Vitals:  Vitals:   05/17/17 0547 05/17/17 0642  BP:    Pulse:    Resp:    Temp:    SpO2: 98% 98%    Last Pain:  Vitals:   05/17/17 0539  TempSrc:   PainSc: 0-No pain   Pain Goal:                 [facility]

## 2017-05-18 ENCOUNTER — Encounter (HOSPITAL_COMMUNITY): Payer: Self-pay | Admitting: Student

## 2017-05-18 ENCOUNTER — Other Ambulatory Visit: Payer: Self-pay

## 2017-05-18 LAB — BIRTH TISSUE RECOVERY COLLECTION (PLACENTA DONATION)

## 2017-05-18 NOTE — Progress Notes (Signed)
Subjective: POD# 2 Information for the patient's newborn:  Patricia, Rivera [[id number]]  female   Name "Patricia Rivera" Circumcision Desires-planned for 05/18/17  Reports feeling rested Feeding: breast Patient reports tolerating PO and denies N/V.  Breast symptoms: None Pain controlled with PO meds Denies HA/SOB/dizziness.  Passing flatus and denies BM  Vaginal bleeding is normal, w/o clots. Ambulating and urinating w/o difficulty     Objective:  VS:    Vitals:   05/17/17 1200 05/17/17 1624 05/17/17 1728 05/18/17 0629  BP: 103/63 106/68 114/83 116/77  Pulse: 76 86 82 78  Resp: 16 16 18 18   Temp: 97.8 F (36.6 C) 98.2 F (36.8 C) 97.7 F (36.5 C) 98.4 F (36.9 C)  TempSrc: Oral Oral Oral Oral  SpO2: 97% 99%  100%  Weight:      Height:          Intake/Output Summary (Last 24 hours) at 05/18/2017 1101 Last data filed at 05/17/2017 1550 Gross per 24 hour  Intake 808.75 ml  Output 1000 ml  Net -191.25 ml       Recent Labs    05/15/17 1110 05/17/17 0521  WBC 10.0 16.5*  HGB 12.8 11.7*  HCT 38.7 34.7*  PLT 272 262    Blood type: --/--/O POS, O POS  (04/03 1110) Rubella: Immune (09/04 0000)    Physical Exam:  General: alert, cooperative and no distress Abdomen: soft, nontender Incision: clean, dry and intact, Honeycomb dressing Perineum:  Uterine Fundus: firm, below umbilicus, nontender Lochia: minimal Ext: No edema, no tenderness, pain, or cords   Assessment/Plan: 27 y.o.   POD# 2. [id number]                  Postpartum care following cesarean delivery for breech Normal PP exam Breastfeeding-inexperienced  Routine post-op PP care          Advised warm fluids and ambulation to improve GI motility Breastfeeding support-Lactation visit completed Anticipate D/C in 05/19/17   Patricia Rivera, [place] 05/18/2017, 11:01 AM

## 2017-05-18 NOTE — Progress Notes (Signed)
Parent request formula to supplement breast feeding due to mother request, not seeing "milk"Parents have been informed of small tummy size of newborn, taught hand expression and understands the possible consequences of formula to the health of the infant. The possible consequences shared with patient include 1) Loss of confidence in breastfeeding 2) Engorgement 3) Allergic sensitization of baby(asthma/allergies) and 4) decreased milk supply for mother.After discussion of the above the mother decided to supplement with formula. The tool used to give formula supplement will be bottle with slow flow nipple.

## 2017-05-18 NOTE — Lactation Note (Signed)
This note was copied from a baby's chart. Lactation Consultation Note Baby 32 hrs old cluster feeding.  Mom has tubular cone shaped breast. Bouncy bulbous areola. RN stated mom had a lot of edema to areola so much that nipple was very short shaft. At this time, mom removed shells, mom has great evert compressible nipples. Mom BF baby in football position w/wide flange. Latch was good, LC turned baby's head slightly to have both cheeks closer to mom. Mom denies painful latching at this time. Praised mom for good latching.  Baby had coordinated suckling at the breast. Baby looks jaundice. Baby's out put great.  Encouraged to assess breast for transfer after feedings.  Mom had Evenflow DEBP. Asked LC to demonstrated set up and usage. LC did so. Mom has hand pump, mom pumped a few minutes, colostrum noted. Praised mom. Gave colostrum container to hand express and spoon feed baby to give mom's nipples a break.  Reviewed newborn feeding habits, STS, I&O, supply and demand. Mom encouraged to feed baby 8-12 times/24 hours and with feeding cues.  Answered many questions parents had. Mom doing appears more confident after consult.  Jennette brochure given w/resources, support groups and [place] services.  Patient Name: Patricia Rivera Today's Date: 05/18/2017 Reason for consult: Initial assessment   Maternal Data Has patient been taught Hand Expression?: Yes Does the patient have breastfeeding experience prior to this delivery?: No  Feeding Feeding Type: Breast Fed Length of feed: 60 min  LATCH Score Latch: Grasps breast easily, tongue down, lips flanged, rhythmical sucking.  Audible Swallowing: Spontaneous and intermittent  Type of Nipple: Everted at rest and after stimulation  Comfort (Breast/Nipple): Soft / non-tender  Hold (Positioning): Assistance needed to correctly position infant at breast and maintain latch.  LATCH Score: 9  Interventions Interventions: Breast feeding basics  reviewed;Support pillows;Assisted with latch;Position options;Skin to skin;Expressed milk;Breast massage;Hand express;Shells;Pre-pump if needed;Hand pump;Breast compression;Adjust position  Lactation Tools Discussed/Used Tools: Shells;Pump Shell Type: Inverted Breast pump type: Manual WIC Program: No Pump Review: Setup, frequency, and cleaning;Milk Storage Initiated by:: RN Date initiated:: 05/17/17   Consult Status Consult Status: Follow-up Date: 05/18/17 Follow-up type: In-patient    Theodoro Kalata 05/18/2017, 2:44 AM

## 2017-05-19 MED ORDER — SIMETHICONE 80 MG PO CHEW: 80.0000 mg | CHEWABLE_TABLET | Freq: Three times a day (TID) | ORAL | 0 refills | Status: DC

## 2017-05-19 MED ORDER — SENNOSIDES-DOCUSATE SODIUM 8.6-50 MG PO TABS: 2.0000 | ORAL_TABLET | ORAL | Status: DC

## 2017-05-19 MED ORDER — COCONUT OIL OIL: 1.0000 "application " | TOPICAL_OIL | 0 refills | Status: DC | PRN

## 2017-05-19 MED ORDER — IBUPROFEN 600 MG PO TABS: 600.0000 mg | ORAL_TABLET | Freq: Four times a day (QID) | ORAL | 0 refills | Status: DC

## 2017-05-19 MED ORDER — OXYCODONE HCL 10 MG PO TABS: 10.0000 mg | ORAL_TABLET | ORAL | 0 refills | Status: DC | PRN

## 2017-05-19 NOTE — Lactation Note (Signed)
This note was copied from a baby's chart. Lactation Consultation Note  Patient Name: Patricia Rivera Today's Date: 05/19/2017   P1, Baby 50 hours old.  Recently circumcised and sleeping. Mom encouraged to feed baby 8-12 times/24 hours and with feeding cues.  Reviewed engorgement care and monitoring voids/stools. Answered questions about pumping and going back to work.     Maternal Data    Feeding    LATCH Score                   Interventions    Lactation Tools Discussed/Used     Consult Status      Carlye Grippe 05/19/2017, 10:56 AM

## 2017-05-19 NOTE — Discharge Summary (Signed)
OB Discharge Summary     Patient Name: Patricia Rivera DOB: 1990-11-22 MRN: [id number]  Date of admission: 05/16/2017 Delivering MD: Brien Few   Date of discharge: 05/19/2017  Admitting diagnosis: Breech Intrauterine pregnancy: Unknown     Secondary diagnosis:  Principal Problem:   Postpartum care following cesarean delivery Indication: Breech (4/4) Active Problems:   Breech birth     Discharge diagnosis: Term Pregnancy Delivered                                                                                                Complications: None  Hospital course:  Sceduled C/S   27 y.o. yo G1P1001 at Unknown was admitted to the hospital 05/16/2017 for scheduled cesarean section with the following indication:Malpresentation.  Membrane Rupture Time/Date: 5:46 PM ,05/16/2017   Patient delivered a Viable infant.05/16/2017  Details of operation can be found in separate operative note.  Pateint had an uncomplicated postpartum course.  She is ambulating, tolerating a regular diet, passing flatus, and urinating well. Patient is discharged home in stable condition on  05/19/17         Physical exam  Vitals:   05/17/17 1728 05/18/17 0629 05/18/17 1900 05/19/17 0520  BP:  116/77 114/69 113/65  Pulse: 82 78 85 73  Resp: 18 18 20 18   Temp: 97.7 F (36.5 C) 98.4 F (36.9 C) 98.5 F (36.9 C) 98.5 F (36.9 C)  TempSrc: Oral Oral Oral Oral  SpO2:  100%    Weight:      Height:       General: alert, cooperative and no distress Lochia: appropriate Uterine Fundus: firm Incision: Healing well with no significant drainage, Dressing is clean, dry, and intact DVT Evaluation: No cords or calf tenderness. No significant calf/ankle edema. Labs: Lab Results  Component Value Date   WBC 16.5 (H) 05/17/2017   HGB 11.7 (L) 05/17/2017   HCT 34.7 (L) 05/17/2017   MCV 88.7 05/17/2017   PLT 262 05/17/2017   No flowsheet data found.  Discharge instruction: per After Visit Summary and "Baby and Me  Booklet".  After visit meds:  Allergies as of 05/19/2017   No Known Allergies     Medication List    STOP taking these medications   diphenhydrAMINE 25 mg capsule Commonly known as:  BENADRYL     TAKE these medications   acetaminophen 325 MG tablet Commonly known as:  TYLENOL Take 650 mg by mouth every 6 (six) hours as needed (for pain.).   albuterol 108 (90 Base) MCG/ACT inhaler Commonly known as:  PROVENTIL HFA;VENTOLIN HFA Inhale 2 puffs into the lungs every 6 (six) hours as needed for wheezing or shortness of breath.   coconut oil Oil Apply 1 application topically as needed.   CRANBERRY PO Take 1 tablet by mouth at bedtime.   ibuprofen 600 MG tablet Commonly known as:  ADVIL,MOTRIN Take 1 tablet (600 mg total) by mouth every 6 (six) hours.   multivitamin-prenatal 27-0.8 MG Tabs tablet Take 1 tablet by mouth at bedtime.   Oxycodone HCl 10 MG Tabs Take 1 tablet (10 mg total)  by mouth every 4 (four) hours as needed for severe pain (pain 7-10).   ranitidine 150 MG tablet Commonly known as:  ZANTAC Take 150 mg by mouth 2 (two) times daily as needed for heartburn.   senna-docusate 8.6-50 MG tablet Commonly known as:  Senokot-S Take 2 tablets by mouth daily. Start taking on:  05/20/2017   simethicone 80 MG chewable tablet Commonly known as:  MYLICON Chew 1 tablet (80 mg total) by mouth 3 (three) times daily after meals.       Diet: routine diet  Activity: Advance as tolerated. Pelvic rest for 6 weeks.   Outpatient follow up:6 weeks Postpartum contraception: Not Discussed  Newborn Data: Live born female  Birth Weight: 7 lb 5.1 oz (3320 g) APGAR: 81, 9  Newborn Delivery   Birth date/time:  05/16/2017 17:46:00 Delivery type:  C-Section, Low Transverse Trial of labor:  No C-section categorization:  Primary     Baby Feeding: Breast Disposition:home with mother   05/19/2017 Juliene Pina, CNM

## 2017-06-20 ENCOUNTER — Encounter (HOSPITAL_COMMUNITY): Payer: Self-pay | Admitting: Obstetrics and Gynecology

## 2017-11-11 DIAGNOSIS — R350 Frequency of micturition: Secondary | ICD-10-CM | POA: Diagnosis not present

## 2017-11-21 DIAGNOSIS — F419 Anxiety disorder, unspecified: Secondary | ICD-10-CM | POA: Diagnosis not present

## 2017-11-21 DIAGNOSIS — R002 Palpitations: Secondary | ICD-10-CM | POA: Diagnosis not present

## 2017-12-11 ENCOUNTER — Encounter (INDEPENDENT_AMBULATORY_CARE_PROVIDER_SITE_OTHER): Payer: 59

## 2017-12-11 ENCOUNTER — Other Ambulatory Visit: Payer: Self-pay | Admitting: Physician Assistant

## 2017-12-11 DIAGNOSIS — R002 Palpitations: Secondary | ICD-10-CM

## 2018-01-21 ENCOUNTER — Other Ambulatory Visit: Payer: Self-pay

## 2018-01-21 ENCOUNTER — Encounter (HOSPITAL_COMMUNITY): Payer: Self-pay

## 2018-01-21 ENCOUNTER — Emergency Department (HOSPITAL_COMMUNITY): Payer: 59

## 2018-01-21 ENCOUNTER — Emergency Department (HOSPITAL_COMMUNITY)
Admission: EM | Admit: 2018-01-21 | Discharge: 2018-01-22 | Disposition: A | Discharge status: Home / Self Care | Payer: 59 | Attending: Emergency Medicine | Admitting: Emergency Medicine

## 2018-01-21 DIAGNOSIS — R110 Nausea: Secondary | ICD-10-CM | POA: Diagnosis not present

## 2018-01-21 DIAGNOSIS — R0602 Shortness of breath: Secondary | ICD-10-CM | POA: Insufficient documentation

## 2018-01-21 DIAGNOSIS — Z7722 Contact with and (suspected) exposure to environmental tobacco smoke (acute) (chronic): Secondary | ICD-10-CM | POA: Insufficient documentation

## 2018-01-21 DIAGNOSIS — J45909 Unspecified asthma, uncomplicated: Secondary | ICD-10-CM | POA: Diagnosis not present

## 2018-01-21 DIAGNOSIS — R079 Chest pain, unspecified: Secondary | ICD-10-CM | POA: Diagnosis not present

## 2018-01-21 DIAGNOSIS — R42 Dizziness and giddiness: Secondary | ICD-10-CM | POA: Insufficient documentation

## 2018-01-21 DIAGNOSIS — R202 Paresthesia of skin: Secondary | ICD-10-CM | POA: Diagnosis not present

## 2018-01-21 LAB — CBC
HCT: 43.4 % (ref 36.0–46.0)
Hemoglobin: 13.6 g/dL (ref 12.0–15.0)
MCH: 29.2 pg (ref 26.0–34.0)
MCHC: 31.3 g/dL (ref 30.0–36.0)
MCV: 93.1 fL (ref 80.0–100.0)
Platelets: 345 10*3/uL (ref 150–400)
RBC: 4.66 MIL/uL (ref 3.87–5.11)
RDW: 12.8 % (ref 11.5–15.5)
WBC: 10.6 10*3/uL — ABNORMAL HIGH (ref 4.0–10.5)
nRBC: 0 % (ref 0.0–0.2)

## 2018-01-21 LAB — BASIC METABOLIC PANEL
Anion gap: 14 (ref 5–15)
BUN: 12 mg/dL (ref 6–20)
CO2: 21 mmol/L — AB (ref 22–32)
Calcium: 9.3 mg/dL (ref 8.9–10.3)
Chloride: 102 mmol/L (ref 98–111)
Creatinine, Ser: 0.88 mg/dL (ref 0.44–1.00)
GFR calc Af Amer: >60 mL/min (ref >60)
GFR calc non Af Amer: >60 mL/min (ref >60)
GLUCOSE: 103 mg/dL — AB (ref 70–99)
Potassium: 3.6 mmol/L (ref 3.5–5.1)
Sodium: 137 mmol/L (ref 135–145)

## 2018-01-21 LAB — I-STAT TROPONIN, ED
Troponin i, poc: 0 ng/mL (ref 0.00–0.08)
Troponin i, poc: 0 ng/mL (ref 0.00–0.08)

## 2018-01-21 LAB — I-STAT BETA HCG BLOOD, ED (MC, WL, AP ONLY): I-stat hCG, quantitative: <5 m[IU]/mL (ref <5)

## 2018-01-21 MED ORDER — IOPAMIDOL (ISOVUE-370) INJECTION 76%
INTRAVENOUS | Status: AC
  Administered 2018-01-21: 100 mL
  Filled 2018-01-21: qty 100

## 2018-01-21 MED ORDER — SODIUM CHLORIDE 0.9 % IV BOLUS
1000.0000 mL | Freq: Once | INTRAVENOUS | Status: AC
  Administered 2018-01-21: 1000 mL via INTRAVENOUS

## 2018-01-21 MED ORDER — MAGNESIUM SULFATE 2 GM/50ML IV SOLN
2.0000 g | Freq: Once | INTRAVENOUS | Status: AC
  Administered 2018-01-21: 2 g via INTRAVENOUS
  Filled 2018-01-21: qty 50

## 2018-01-21 NOTE — ED Provider Notes (Signed)
[facility] Emergency Department Provider Note MRN:  [id number]  Arrival date & time: 01/22/18     Chief Complaint   Chest pain History of Present Illness   Patricia Rivera is a 27 y.o. year-old female with no significant past medical history presenting to the ED with chief complaint of chest pain.  Sudden onset chest pain that began 4 hours ago.  Located in the center of the chest, described as a sharp pain, associated with shortness of breath and significant lightheadedness.  Did not lose consciousness.  Denies headache or vision change.  Associated with mild nausea, paresthesia to the left arm.  Denies abdominal pain, no recent fevers, no dysuria, no numbness or weakness to the arms or legs, no leg pain or swelling.  Use birth control pills, but stopped 1 week ago.  No new medications, no alcohol or cocaine or other drugs.  Has had more mild symptoms of palpitations in the past, has been evaluated with a home cardiac monitor, results not back yet.  Review of Systems  A complete 10 system review of systems was obtained and all systems are negative except as noted in the HPI and PMH.   Patient's Health History    Past Medical History:  Diagnosis Date  . Childhood asthma   . H/O chest pain   . Migraine     Past Surgical History:  Procedure Laterality Date  . CESAREAN SECTION N/A 05/16/2017   Procedure: Primary CESAREAN SECTION;  Surgeon: Brien Few, MD;  Location: [place];  Service: Obstetrics;  Laterality: N/A;  EDD: 05/23/17  . INDUCED ABORTION     by dilation and evacuation  . LYMPHADENECTOMY    . OTHER SURGICAL HISTORY  2010   lymph node removal    Family History  Problem Relation Age of Onset  . Heart attack Mother   . Osteoporosis Mother   . Hypertension Mother   . Diabetes Maternal Grandmother   . Hypertension Maternal Grandmother   . Cerebrovascular Accident Maternal Grandfather   . Cancer Maternal Grandfather     Social History    Socioeconomic History  . Marital status: Married    Spouse name: Not on file  . Number of children: Not on file  . Years of education: Not on file  . Highest education level: Not on file  Occupational History  . Occupation: dental asst  Social Needs  . Financial resource strain: Not on file  . Food insecurity:    Worry: Not on file    Inability: Not on file  . Transportation needs:    Medical: Not on file    Non-medical: Not on file  Tobacco Use  . Smoking status: Passive Smoke Exposure - Never Smoker  . Smokeless tobacco: Never Used  Substance and Sexual Activity  . Alcohol use: No    Frequency: Never  . Drug use: No  . Sexual activity: Yes    Birth control/protection: Pill  Lifestyle  . Physical activity:    Days per week: Not on file    Minutes per session: Not on file  . Stress: Not on file  Relationships  . Social connections:    Talks on phone: Not on file    Gets together: Not on file    Attends religious service: Not on file    Active member of club or organization: Not on file    Attends meetings of clubs or organizations: Not on file    Relationship status: Not on file  .  Intimate partner violence:    Fear of current or ex partner: Not on file    Emotionally abused: Not on file    Physically abused: Not on file    Forced sexual activity: Not on file  Other Topics Concern  . Not on file  Social History Narrative  . Not on file     Physical Exam  Vital Signs and Nursing Notes reviewed Vitals:   01/21/18 1914 01/21/18 2218  BP: (!) 149/93 118/88  Pulse: (!) 119 100  Resp: 18 15  Temp: 98.3 F (36.8 C) 98.2 F (36.8 C)  SpO2: 100% 98%    CONSTITUTIONAL: Well-appearing, NAD NEURO:  Alert and oriented x 3, no focal deficits EYES:  eyes equal and reactive ENT/NECK:  no LAD, no JVD CARDIO: Tachycardic rate, well-perfused, normal S1 and S2 PULM:  CTAB no wheezing or rhonchi GI/GU:  normal bowel sounds, non-distended, non-tender MSK/SPINE:  No  gross deformities, no edema SKIN:  no rash, atraumatic PSYCH:  Appropriate speech and behavior  Diagnostic and Interventional Summary    EKG Interpretation  Date/Time:  Tuesday January 21 2018 22:52:38 EST Ventricular Rate:  102 PR Interval:  138 QRS Duration: 92 QT Interval:  368 QTC Calculation: 479 R Axis:   64 Text Interpretation:  Sinus tachycardia Otherwise normal ECG Confirmed by Gerlene Fee [phone]) on 01/21/2018 11:06:33 PM      Labs Reviewed  BASIC METABOLIC PANEL - Abnormal; Notable for the following components:      Result Value   CO2 21 (*)    Glucose, Bld 103 (*)    All other components within normal limits  CBC - Abnormal; Notable for the following components:   WBC 10.6 (*)    All other components within normal limits  I-STAT TROPONIN, ED  I-STAT BETA HCG BLOOD, ED ([facility], [facility], [facility] ONLY)  I-STAT TROPONIN, ED    CT Angio Chest PE W and/or Wo Contrast  Final Result    DG Chest Port 1 View  Final Result      Medications  sodium chloride 0.9 % bolus 1,000 mL (0 mLs Intravenous Stopped 01/21/18 2228)  magnesium sulfate IVPB 2 g 50 mL (0 g Intravenous Stopped 01/21/18 2135)  iopamidol (ISOVUE-370) 76 % injection (100 mLs  Contrast Given 01/21/18 2231)  sodium chloride 0.9 % bolus 1,000 mL (0 mLs Intravenous Stopped 01/21/18 2341)     Procedures Critical Care  ED Course and Medical Decision Making  I have reviewed the triage vital signs and the nursing notes.  Pertinent labs & imaging results that were available during my care of the patient were reviewed by me and considered in my medical decision making (see below for details).  Concern for possible pulmonary embolism in this 27 year old female with sharp chest pain, shortness of breath, currently without explained tachycardia, positive family history for blood clots.  Will need CTA of the chest.  Patient also with prolonged QT over 600 on initial EKG, will provide IV magnesium and monitor  closely.  Clinical Course as of Jan 22 5  Tue Jan 21, 2018  2246 Spoke with cardiologist on-call, who feels that the QT interval is falsely calculated on the initial EKG.  Recommending repeat.  As of now, not seeing any indication for admission, appropriate for outpatient cardiology follow-up.  CTPA still pending.   [MB]    Clinical Course User Index [MB] Maudie Flakes, MD     Repeat EKG is reassuring with normal intervals.  CTPA is normal.  Patient's heart rate is improved to 94 on my reassessment.  Patient is asymptomatic.  Appropriate for outpatient management, will follow-up with her primary care provider who is already starting the work-up for possible tachyarrhythmia.  Provided with contact information for [facility] cardiology as well.  Strict return precautions.  After the discussed management above, the patient was determined to be safe for discharge.  The patient was in agreement with this plan and all questions regarding their care were answered.  ED return precautions were discussed and the patient will return to the ED with any significant worsening of condition.  Barth Kirks. [place] Small, [place] [email or web address]  Final Clinical Impressions(s) / ED Diagnoses     ICD-10-CM   1. Chest pain, unspecified type R07.9   2. SOB (shortness of breath) R06.02 DG Chest [facility]    DG Chest [facility]    ED Discharge Orders    None         Maudie Flakes, MD 01/22/18 [phone]

## 2018-01-21 NOTE — ED Triage Notes (Signed)
Pt. Reports Shob, non-radiating cp, and left arm numbness that started around 1550 today. Pt. States that she got very light headed when symptoms started.  A/O X4

## 2018-01-22 NOTE — ED Notes (Signed)
Family asking questions, Provider bedside.

## 2018-01-22 NOTE — Discharge Instructions (Addendum)
You were evaluated in the Emergency Department and after careful evaluation, we did not find any emergent condition requiring admission or further testing in the hospital.  Your lab work and CT today were very reassuring.  Your elevated heart rate is improved but should still be followed up with by your primary care provider as well as a cardiologist.  Please return to the Emergency Department if you experience any worsening of your condition.  We encourage you to follow up with a primary care provider.  Thank you for allowing [place] to be a part of your care.

## 2018-03-12 DIAGNOSIS — I499 Cardiac arrhythmia, unspecified: Secondary | ICD-10-CM | POA: Diagnosis not present

## 2018-03-12 DIAGNOSIS — F419 Anxiety disorder, unspecified: Secondary | ICD-10-CM | POA: Diagnosis not present

## 2018-09-24 DIAGNOSIS — I493 Ventricular premature depolarization: Secondary | ICD-10-CM | POA: Diagnosis not present

## 2018-10-15 DIAGNOSIS — G500 Trigeminal neuralgia: Secondary | ICD-10-CM | POA: Diagnosis not present

## 2018-10-28 DIAGNOSIS — R350 Frequency of micturition: Secondary | ICD-10-CM | POA: Diagnosis not present

## 2018-10-28 DIAGNOSIS — N390 Urinary tract infection, site not specified: Secondary | ICD-10-CM | POA: Diagnosis not present

## 2018-11-24 MED FILL — METOPROLOL SUCCINATE ER 50: 50 | 30 days supply | Qty: 30 | Fill #0

## 2018-12-25 MED FILL — METOPROLOL SUCCINATE ER 50: 50 | 30 days supply | Qty: 30 | Fill #1

## 2019-01-20 ENCOUNTER — Other Ambulatory Visit: Payer: Self-pay

## 2019-01-20 DIAGNOSIS — Z20822 Contact with and (suspected) exposure to covid-19: Secondary | ICD-10-CM

## 2019-01-22 LAB — NOVEL CORONAVIRUS, NAA: SARS-CoV-2, NAA: NOT DETECTED

## 2019-01-23 ENCOUNTER — Telehealth: Payer: Self-pay | Admitting: *Deleted

## 2019-01-23 NOTE — Telephone Encounter (Signed)
Patient given negative covid results . 

## 2019-01-26 MED FILL — METOPROLOL SUCCINATE ER 50: 50 | 30 days supply | Qty: 30 | Fill #2

## 2019-01-28 DIAGNOSIS — Z3200 Encounter for pregnancy test, result unknown: Secondary | ICD-10-CM | POA: Diagnosis not present

## 2019-01-28 DIAGNOSIS — Z3689 Encounter for other specified antenatal screening: Secondary | ICD-10-CM | POA: Diagnosis not present

## 2019-02-12 DIAGNOSIS — Z3200 Encounter for pregnancy test, result unknown: Secondary | ICD-10-CM | POA: Diagnosis not present

## 2019-02-13 NOTE — L&D Delivery Note (Addendum)
Delivery Note- VBAC  At 3:27 PM a viable and healthy female was delivered via Vaginal, Spontaneous (Presentation:   Occiput Anterior).  APGAR: 9, 9; weight 7 lb 13.2 oz (3550 g).   Placenta status: Spontaneous, Intact.  Cord: 3 vessels with the following complications: None.  Cord pH: na  Anesthesia: Epidural Episiotomy: None Lacerations: 2nd degree Suture Repair: 2.0 vicryl rapide Est. Blood Loss (mL): 100  Mom to postpartum.  Baby to Couplet care / Skin to Skin. Successful VBAC  Patricia Rivera J 09/30/2019, 7:29 PM

## 2019-02-24 MED FILL — METOPROLOL SUCCINATE ER 50: 50 | 30 days supply | Qty: 30 | Fill #3

## 2019-02-27 DIAGNOSIS — Z3689 Encounter for other specified antenatal screening: Secondary | ICD-10-CM | POA: Diagnosis not present

## 2019-02-27 DIAGNOSIS — O09291 Supervision of pregnancy with other poor reproductive or obstetric history, first trimester: Secondary | ICD-10-CM | POA: Diagnosis not present

## 2019-02-27 LAB — OB RESULTS CONSOLE ANTIBODY SCREEN: Antibody Screen: NEGATIVE

## 2019-02-27 LAB — OB RESULTS CONSOLE HIV ANTIBODY (ROUTINE TESTING): HIV: NONREACTIVE

## 2019-02-27 LAB — OB RESULTS CONSOLE HEPATITIS B SURFACE ANTIGEN: Hepatitis B Surface Ag: NEGATIVE

## 2019-02-27 LAB — OB RESULTS CONSOLE RUBELLA ANTIBODY, IGM: Rubella: IMMUNE

## 2019-02-27 LAB — OB RESULTS CONSOLE GC/CHLAMYDIA
Chlamydia: NEGATIVE
Gonorrhea: NEGATIVE

## 2019-02-27 LAB — OB RESULTS CONSOLE ABO/RH: RH Type: POSITIVE

## 2019-02-27 LAB — OB RESULTS CONSOLE RPR: RPR: NONREACTIVE

## 2019-02-27 MED FILL — CHLORPROMAZINE 25 MG TABLET: 25 | 15 days supply | Qty: 60 | Fill #0

## 2019-03-09 DIAGNOSIS — Z3480 Encounter for supervision of other normal pregnancy, unspecified trimester: Secondary | ICD-10-CM | POA: Diagnosis not present

## 2019-03-09 DIAGNOSIS — O09291 Supervision of pregnancy with other poor reproductive or obstetric history, first trimester: Secondary | ICD-10-CM | POA: Diagnosis not present

## 2019-03-09 DIAGNOSIS — Z3689 Encounter for other specified antenatal screening: Secondary | ICD-10-CM | POA: Diagnosis not present

## 2019-03-09 DIAGNOSIS — Z118 Encounter for screening for other infectious and parasitic diseases: Secondary | ICD-10-CM | POA: Diagnosis not present

## 2019-03-17 ENCOUNTER — Ambulatory Visit: Payer: Self-pay | Admitting: Cardiology

## 2019-03-24 MED FILL — METOPROLOL SUCCINATE ER 50: 50 | 30 days supply | Qty: 30 | Fill #4

## 2019-03-27 NOTE — Progress Notes (Signed)
Patient referred by Kelton Pillar, MD for PVC's  Subjective:   Patricia Rivera, female    DOB: 04/25/1990, 29 y.o.   MRN: [id number]   Chief Complaint  Patient presents with  . PVCs  . Chest Pain  . New Patient (Initial Visit)  Sotalol  HPI  29 y.o. Caucasian female with palpitations.  Patient works as a Art therapist.  She is currently 30-week pregnant with her second child.  She has experienced palpitations which were diagnosed as PVCs.  Symptoms started after delivery of her first child, and has progressively worsened in frequency and severity since then.  She was initially metoprolol tartrate 25 mg twice daily, then changed to metoprolol succinate 50 mg daily which she has continued through her first trimester.  Symptoms are described as extra beat, that seems to cause flutter sensation in her chest.  It affects her work and quality of life.  She does not have any clear angina or dyspnea symptoms.  She also denies any presyncope or syncope symptoms.  She is obviously not drinking alcohol during her pregnancy.  She is also stopped any caffeine intake.  However, symptoms seem to get worse.  She has a family history of early coronary artery disease with her mother having had MI in her 37s.  She has history of atrial fibrillation in her grandparents.  Past Medical History:  Diagnosis Date  . Childhood asthma   . H/O chest pain   . Migraine      Past Surgical History:  Procedure Laterality Date  . CESAREAN SECTION N/A 05/16/2017   Procedure: Primary CESAREAN SECTION;  Surgeon: Brien Few, MD;  Location: [place];  Service: Obstetrics;  Laterality: N/A;  EDD: 05/23/17  . INDUCED ABORTION     by dilation and evacuation  . LYMPHADENECTOMY    . OTHER SURGICAL HISTORY  2010   lymph node removal     Social History   Tobacco Use  Smoking Status Passive Smoke Exposure - Never Smoker  Smokeless Tobacco Never Used    Social History   Substance and Sexual  Activity  Alcohol Use No     Family History  Problem Relation Age of Onset  . Heart attack Mother   . Osteoporosis Mother   . Hypertension Mother   . Diabetes Maternal Grandmother   . Hypertension Maternal Grandmother   . Cerebrovascular Accident Maternal Grandfather   . Cancer Maternal Grandfather      Current Outpatient Medications on File Prior to Visit  Medication Sig Dispense Refill  . acetaminophen (TYLENOL) 325 MG tablet Take 650 mg by mouth every 6 (six) hours as needed (for pain).     Marland Kitchen albuterol (PROVENTIL HFA;VENTOLIN HFA) 108 (90 Base) MCG/ACT inhaler Inhale 2 puffs into the lungs every 6 (six) hours as needed for wheezing or shortness of breath. (Patient not taking: Reported on 01/21/2018) 1 Inhaler 2  . ALPRAZolam (XANAX) 0.25 MG tablet Take 0.25 mg by mouth 2 (two) times daily as needed for anxiety.   0  . coconut oil OIL Apply 1 application topically as needed. (Patient not taking: Reported on 01/21/2018)  0  . ibuprofen (ADVIL,MOTRIN) 600 MG tablet Take 1 tablet (600 mg total) by mouth every 6 (six) hours. (Patient not taking: Reported on 01/21/2018) 30 tablet 0  . oxyCODONE 10 MG TABS Take 1 tablet (10 mg total) by mouth every 4 (four) hours as needed for severe pain (pain 7-10). (Patient not taking: Reported on 01/21/2018) 30 tablet  0  . senna-docusate (SENOKOT-S) 8.6-50 MG tablet Take 2 tablets by mouth daily. (Patient not taking: Reported on 01/21/2018)    . simethicone (MYLICON) 80 MG chewable tablet Chew 1 tablet (80 mg total) by mouth 3 (three) times daily after meals. (Patient not taking: Reported on 01/21/2018) 30 tablet 0   No current facility-administered medications on file prior to visit.    Cardiovascular and other pertinent studies:  EKG 03/30/2019: Sinus rhythm 90 bpm. Nonspecific T-abnormality.   Recent labs: 02/27/2019: H/H 13.9/40.6. MCV 87.0. Platelets 353   Review of Systems  Cardiovascular: Positive for palpitations. Negative for chest  pain, dyspnea on exertion, leg swelling and syncope.         Vitals:   03/30/19 0845  BP: 115/86  Pulse: 94  Temp: 98 F (36.7 C)  SpO2: 100%     Body mass index is 28.89 kg/m. Filed Weights   03/30/19 0845  Weight: 179 lb (81.2 kg)     Objective:   Physical Exam  Constitutional: She appears well-developed and well-nourished.  Neck: No JVD present.  Cardiovascular: Normal rate, regular rhythm, normal heart sounds and intact distal pulses.  No murmur heard. Pulmonary/Chest: Effort normal and breath sounds normal. She has no wheezes. She has no rales.  Musculoskeletal:        General: No edema.  Nursing note and vitals reviewed.       Assessment & Recommendations:   29 year old Caucasian female, insect trimester pregnancy, with symptomatic PVCs  Symptomatic PVCs: She understands that PVCs by themselves is a benign diagnosis, but Symptoms affecting her work and quality of life.  Structurally normal heart on echocardiogram in 2017.  Recommend 4-week event monitor. Will check TSH. I discussed nonpharmacological means, including vagal maneuvers.  We also discussed restricting activity such as walking, yoga, meditation to reduce likely related to PVCs. In addition, we discussed pharmacological management.  She is currently on metoprolol succinate 50 mg daily with worsening symptoms.  Will switch to Verapamil 20 mg daily. Given that no medication is completely safe in pregnancy and most medications fall in category C, which means no definite studies to show clear evidence of fetal harm.  Patient understands the risks and benefits and would like to try verapamil 120 mg daily.   Virtual visit f/u in 4-6 weeks.      Thank you for referring the patient to [place]. Please feel free to contact with any questions.  Nigel Mormon, MD [facility]. [place] Pager: [phone] Office: [phone]

## 2019-03-29 DIAGNOSIS — I493 Ventricular premature depolarization: Secondary | ICD-10-CM | POA: Insufficient documentation

## 2019-03-30 ENCOUNTER — Ambulatory Visit (INDEPENDENT_AMBULATORY_CARE_PROVIDER_SITE_OTHER): Payer: 59 | Admitting: Cardiology

## 2019-03-30 ENCOUNTER — Encounter: Payer: Self-pay | Admitting: Cardiology

## 2019-03-30 ENCOUNTER — Ambulatory Visit: Payer: 59

## 2019-03-30 ENCOUNTER — Other Ambulatory Visit: Payer: Self-pay

## 2019-03-30 VITALS — BP 115/86 | HR 94 | Temp 98.0°F | Ht 66.0 in | Wt 179.0 lb

## 2019-03-30 DIAGNOSIS — I493 Ventricular premature depolarization: Secondary | ICD-10-CM

## 2019-03-30 MED ORDER — VERAPAMIL HCL ER 120 MG PO TBCR: 120.0000 mg | EXTENDED_RELEASE_TABLET | Freq: Every day | ORAL | 1 refills | Status: DC

## 2019-03-30 MED FILL — VERAPAMIL ER 120 MG TABLET: 120 | 30 days supply | Qty: 30 | Fill #0

## 2019-04-02 DIAGNOSIS — R002 Palpitations: Secondary | ICD-10-CM | POA: Diagnosis not present

## 2019-04-17 ENCOUNTER — Ambulatory Visit: Payer: 59 | Admitting: Cardiovascular Disease

## 2019-04-22 DIAGNOSIS — Z361 Encounter for antenatal screening for raised alphafetoprotein level: Secondary | ICD-10-CM | POA: Diagnosis not present

## 2019-04-30 MED FILL — VERAPAMIL ER 120 MG TABLET: 120 | 30 days supply | Qty: 30 | Fill #1

## 2019-05-14 ENCOUNTER — Telehealth: Payer: 59 | Admitting: Cardiology

## 2019-05-18 DIAGNOSIS — Z363 Encounter for antenatal screening for malformations: Secondary | ICD-10-CM | POA: Diagnosis not present

## 2019-05-25 ENCOUNTER — Telehealth: Payer: 59 | Admitting: Cardiology

## 2019-05-25 ENCOUNTER — Other Ambulatory Visit: Payer: Self-pay

## 2019-05-25 ENCOUNTER — Encounter: Payer: Self-pay | Admitting: Cardiology

## 2019-05-25 VITALS — BP 110/58 | HR 70

## 2019-05-25 DIAGNOSIS — I491 Atrial premature depolarization: Secondary | ICD-10-CM | POA: Insufficient documentation

## 2019-05-25 DIAGNOSIS — I493 Ventricular premature depolarization: Secondary | ICD-10-CM

## 2019-05-25 MED ORDER — VERAPAMIL HCL ER 240 MG PO TBCR: 120.0000 mg | EXTENDED_RELEASE_TABLET | Freq: Every day | ORAL | 1 refills | Status: DC

## 2019-05-25 MED FILL — VERAPAMIL ER 240 MG TABLET: 240 | 90 days supply | Qty: 45 | Fill #0

## 2019-05-25 NOTE — Progress Notes (Signed)
Patient referred by Kelton Pillar, MD for PVC's  Subjective:   Patricia Rivera, female    DOB: Feb 16, 1990, 29 y.o.   MRN: [id number]  I connected with the patient on 05/25/2019 by a telephone call and verified that I am speaking with the correct person using two identifiers.     I offered the patient a video enabled application for a virtual visit. Unfortunately, this could not be accomplished due to technical difficulties/lack of video enabled phone/computer. I discussed the limitations of evaluation and management by telemedicine and the availability of in person appointments. The patient expressed understanding and agreed to proceed.   This visit type was conducted due to national recommendations for restrictions regarding the COVID-19 Pandemic (e.g. social distancing).  This format is felt to be most appropriate for this patient at this time.  All issues noted in this document were discussed and addressed.  No physical exam was performed (except for noted visual exam findings with Tele health visits).  The patient has consented to conduct a Tele health visit and understands insurance will be billed.    Chief Complaint  Patient presents with  . Palpitations   HPI  29 y.o. Caucasian female with symptomatic PAC/PVC's.   Patient is currently pregnant with her second child, EDD 09/2019. She has had improvement in her symptoms, but no complete resolution yet. She is tolerating verapamil well without any side effects. She wonders if it would be okay to increase th dose of verapamil.   Initial consultation HPI 03/2019: Patient works as a Art therapist.  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  She has experienced palpitations which were diagnosed as PVCs.  Symptoms started after delivery of her first child, and has progressively worsened in frequency and severity since then.  She was initially metoprolol tartrate 25 mg twice daily, then changed to metoprolol succinate 50  mg daily which she has continued through her first trimester.  Symptoms are described as extra beat, that seems to cause flutter sensation in her chest.  It affects her work and quality of life.  She does not have any clear angina or dyspnea symptoms.  She also denies any presyncope or syncope symptoms.  She is obviously not drinking alcohol during her pregnancy.  She is also stopped any caffeine intake.  However, symptoms seem to get worse.  She has a family history of early coronary artery disease with her mother having had MI in her 71s.  She has history of atrial fibrillation in her grandparents.   Current Outpatient Medications on File Prior to Visit  Medication Sig Dispense Refill  . acetaminophen (TYLENOL) 325 MG tablet Take 650 mg by mouth every 6 (six) hours as needed (for pain).     . Prenatal Vit-DSS-Fe Cbn-FA (PRENATAL AD PO) Take 1 tablet by mouth daily.    . verapamil (CALAN-SR) 120 MG CR tablet Take 1 tablet (120 mg total) by mouth at bedtime. 30 tablet 1   No current facility-administered medications on file prior to visit.    Cardiovascular and other pertinent studies:  Event monitor 04/02/2019 - 05/04/2019: Diagnostic time: 51%. This limits sensitivity of the study.  Dominant rhythm: Sinus. HR 63-174 bpm. Avg HR 89 bpm. Multiple PVC's, occasional PAC's. Most correlate with symptoms of flutter/skipped beats.  No atrial fibrillation/atrial flutter/SVT/VT/high grade AV block, sinus pause >3sec noted.  EKG 03/30/2019: Sinus rhythm 90 bpm. Nonspecific T-abnormality.   Recent labs: 02/27/2019: H/H 13.9/40.6. MCV 87.0. Platelets 353  Review of Systems  Cardiovascular: Positive for palpitations. Negative for chest pain, dyspnea on exertion, leg swelling and syncope.         Vitals:   05/18/19 1506  BP: (!) 110/58  Pulse: 70      Objective:   Physical Exam  Not performed. Telephone visit.       Assessment & Recommendations:   29 y.o. Caucasian female with  symptomatic PAC/PVC's.   Symptomatic PVCs: She understands that PVCs by themselves is a benign diagnosis, but symptoms affecting her work and quality of life.  Structurally normal heart on echocardiogram in 2017.  I discussed nonpharmacological means, including vagal maneuvers.  We also discussed restricting activity such as walking, yoga, meditation to reduce likely related to PVCs. In addition, we discussed pharmacological management.  She has noticed improvement with switching metoprolol succinate 50 mg daily to verapamil 120 mg daily. Okay to increase to 240 mg daily for further improving her symptoms. Encourage increased hydration to avoid hypotension.  Given that no medication is completely safe in pregnancy and most medications fall in category C, which means no definite studies to show clear evidence of fetal harm.  Patient understands the risks and benefits. I have encouraged her to keep her OBGYN updated about her cardiac medications.   Virtual visit f/u in 3 months.   Nigel Mormon, MD [facility]. [place] Pager: [phone] Office: [phone]

## 2019-05-28 ENCOUNTER — Telehealth: Payer: Self-pay

## 2019-05-28 NOTE — Telephone Encounter (Signed)
Telephone encounter:  Reason for call: pt thought you said take 240 mg of Verapamil, but prescription sad take half, please advise   Usual provider: MP  Last office visit: 05/25/19 Virtual  Next office visit: 08/26/19   Last hospitalization: NA   Current Outpatient Medications on File Prior to Visit  Medication Sig Dispense Refill  . acetaminophen (TYLENOL) 325 MG tablet Take 650 mg by mouth every 6 (six) hours as needed (for pain).     . Prenatal Vit-DSS-Fe Cbn-FA (PRENATAL AD PO) Take 1 tablet by mouth daily.    . verapamil (CALAN-SR) 240 MG CR tablet Take 0.5 tablets (120 mg total) by mouth daily. 90 tablet 1   No current facility-administered medications on file prior to visit.

## 2019-05-28 NOTE — Telephone Encounter (Signed)
My apologies and oversight. She should take 240 mg 1 pill daily. Can you send a 240 mg 1 pill daily X 90 pills X2 refills please?  Thanks MJP

## 2019-06-09 DIAGNOSIS — Z362 Encounter for other antenatal screening follow-up: Secondary | ICD-10-CM | POA: Diagnosis not present

## 2019-06-29 DIAGNOSIS — O09292 Supervision of pregnancy with other poor reproductive or obstetric history, second trimester: Secondary | ICD-10-CM | POA: Diagnosis not present

## 2019-06-29 DIAGNOSIS — Z3A26 26 weeks gestation of pregnancy: Secondary | ICD-10-CM | POA: Diagnosis not present

## 2019-06-29 DIAGNOSIS — Z3689 Encounter for other specified antenatal screening: Secondary | ICD-10-CM | POA: Diagnosis not present

## 2019-07-07 DIAGNOSIS — O99810 Abnormal glucose complicating pregnancy: Secondary | ICD-10-CM | POA: Diagnosis not present

## 2019-07-07 DIAGNOSIS — Z3A27 27 weeks gestation of pregnancy: Secondary | ICD-10-CM | POA: Diagnosis not present

## 2019-07-14 ENCOUNTER — Telehealth: Payer: Self-pay

## 2019-07-14 ENCOUNTER — Other Ambulatory Visit: Payer: Self-pay | Admitting: Cardiology

## 2019-07-14 DIAGNOSIS — Z23 Encounter for immunization: Secondary | ICD-10-CM | POA: Diagnosis not present

## 2019-07-14 DIAGNOSIS — Z3689 Encounter for other specified antenatal screening: Secondary | ICD-10-CM | POA: Diagnosis not present

## 2019-07-14 MED ORDER — VERAPAMIL HCL ER 240 MG PO TBCR: 240.0000 mg | EXTENDED_RELEASE_TABLET | Freq: Every day | ORAL | 1 refills | Status: DC

## 2019-07-14 MED FILL — VERAPAMIL ER 240 MG TABLET: 240 | 90 days supply | Qty: 90 | Fill #0

## 2019-07-14 NOTE — Telephone Encounter (Signed)
error 

## 2019-08-11 DIAGNOSIS — O09293 Supervision of pregnancy with other poor reproductive or obstetric history, third trimester: Secondary | ICD-10-CM | POA: Diagnosis not present

## 2019-08-11 DIAGNOSIS — Z3A32 32 weeks gestation of pregnancy: Secondary | ICD-10-CM | POA: Diagnosis not present

## 2019-08-18 DIAGNOSIS — O4693 Antepartum hemorrhage, unspecified, third trimester: Secondary | ICD-10-CM | POA: Diagnosis not present

## 2019-08-18 DIAGNOSIS — Z3A33 33 weeks gestation of pregnancy: Secondary | ICD-10-CM | POA: Diagnosis not present

## 2019-08-21 DIAGNOSIS — Z3A33 33 weeks gestation of pregnancy: Secondary | ICD-10-CM | POA: Diagnosis not present

## 2019-08-21 DIAGNOSIS — O4693 Antepartum hemorrhage, unspecified, third trimester: Secondary | ICD-10-CM | POA: Diagnosis not present

## 2019-08-21 MED FILL — PANTOPRAZOLE SOD DR 40 MG T: 40 | 30 days supply | Qty: 30 | Fill #1

## 2019-08-26 ENCOUNTER — Other Ambulatory Visit: Payer: Self-pay

## 2019-08-26 ENCOUNTER — Telehealth: Payer: 59 | Admitting: Cardiology

## 2019-08-26 ENCOUNTER — Encounter: Payer: Self-pay | Admitting: Cardiology

## 2019-08-26 VITALS — BP 112/60 | HR 80

## 2019-08-26 DIAGNOSIS — I491 Atrial premature depolarization: Secondary | ICD-10-CM | POA: Diagnosis not present

## 2019-08-26 DIAGNOSIS — I493 Ventricular premature depolarization: Secondary | ICD-10-CM | POA: Diagnosis not present

## 2019-08-26 NOTE — Progress Notes (Signed)
Patient referred by Kelton Pillar, MD for PVC's  Subjective:   Patricia Rivera, female    DOB: 08/26/1990, 29 y.o.   MRN: [id number]  I connected with the patient on 08/26/2019 by a telephone call and verified that I am speaking with the correct person using two identifiers.     I offered the patient a video enabled application for a virtual visit. Unfortunately, this could not be accomplished due to technical difficulties/lack of video enabled phone/computer. I discussed the limitations of evaluation and management by telemedicine and the availability of in person appointments. The patient expressed understanding and agreed to proceed.   This visit type was conducted due to national recommendations for restrictions regarding the COVID-19 Pandemic (e.g. social distancing).  This format is felt to be most appropriate for this patient at this time.  All issues noted in this document were discussed and addressed.  No physical exam was performed (except for noted visual exam findings with Tele health visits).  The patient has consented to conduct a Tele health visit and understands insurance will be billed.    Chief Complaint  Patient presents with  . Palpitations   HPI  29 y.o. Caucasian female with symptomatic PAC/PVC's.   Patient is currently pregnant with her second child, EDD 09/2019. Her palpitations have nearly resolved on verapamil 240 mg daily. She has had no pregnancy related complications. She is hoping for a natural birth. She asks if verapamil would have any implications on lactation.  Initial consultation HPI 03/2019: Patient works as a Art therapist.  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  She has experienced palpitations which were diagnosed as PVCs.  Symptoms started after delivery of her first child, and has progressively worsened in frequency and severity since then.  She was initially metoprolol tartrate 25 mg twice daily, then changed to metoprolol  succinate 50 mg daily which she has continued through her first trimester.  Symptoms are described as extra beat, that seems to cause flutter sensation in her chest.  It affects her work and quality of life.  She does not have any clear angina or dyspnea symptoms.  She also denies any presyncope or syncope symptoms.  She is obviously not drinking alcohol during her pregnancy.  She is also stopped any caffeine intake.  However, symptoms seem to get worse.  She has a family history of early coronary artery disease with her mother having had MI in her 40s.  She has history of atrial fibrillation in her grandparents.   Current Outpatient Medications on File Prior to Visit  Medication Sig Dispense Refill  . acetaminophen (TYLENOL) 325 MG tablet Take 650 mg by mouth every 6 (six) hours as needed (for pain).     . Prenatal Vit-DSS-Fe Cbn-FA (PRENATAL AD PO) Take 1 tablet by mouth daily.    . verapamil (CALAN-SR) 240 MG CR tablet Take 1 tablet (240 mg total) by mouth at bedtime. 180 tablet 1   No current facility-administered medications on file prior to visit.    Cardiovascular and other pertinent studies:  Event monitor 04/02/2019 - 05/04/2019: Diagnostic time: 51%. This limits sensitivity of the study.  Dominant rhythm: Sinus. HR 63-174 bpm. Avg HR 89 bpm. Multiple PVC's, occasional PAC's. Most correlate with symptoms of flutter/skipped beats.  No atrial fibrillation/atrial flutter/SVT/VT/high grade AV block, sinus pause >3sec noted.  EKG 03/30/2019: Sinus rhythm 90 bpm. Nonspecific T-abnormality.   Recent labs: 02/27/2019: H/H 13.9/40.6. MCV 87.0. Platelets 353   Review  of Systems  Cardiovascular: Positive for palpitations. Negative for chest pain, dyspnea on exertion, leg swelling and syncope.         Vitals:   08/26/19 1247  BP: 112/60  Pulse: 80      Objective:   Physical Exam  Not performed. Telephone visit.       Assessment & Recommendations:   29 y.o. Caucasian  female with symptomatic PAC/PVC's.   Symptomatic PVCs: She understands that PVCs by themselves is a benign diagnosis, but symptoms affecting her work and quality of life.  Structurally normal heart on echocardiogram in 2017.  In initial visits, I discussed nonpharmacological means, including vagal maneuvers.  We also discussed restricting activity such as walking, yoga, meditation to reduce likely related to PVCs. In addition, we discussed pharmacological management.   Given that no medication is completely safe in pregnancy and most medications fall in category C, which means no definite studies to show clear evidence of fetal harm.  Patient understands the risks and benefits.   She has tolerated verapamil 240 mg with resolution of her symptoms without any significant side effects. <360 mg/day of verapamil should have negligible levels in breast milk.  I will see her back in 6 months  Rashawna Scoles Esther Hardy, MD [facility] Pager: [phone] Office: [phone]

## 2019-08-27 DIAGNOSIS — O4693 Antepartum hemorrhage, unspecified, third trimester: Secondary | ICD-10-CM | POA: Diagnosis not present

## 2019-08-27 DIAGNOSIS — Z3A34 34 weeks gestation of pregnancy: Secondary | ICD-10-CM | POA: Diagnosis not present

## 2019-09-03 DIAGNOSIS — Z3685 Encounter for antenatal screening for Streptococcus B: Secondary | ICD-10-CM | POA: Diagnosis not present

## 2019-09-03 DIAGNOSIS — O4693 Antepartum hemorrhage, unspecified, third trimester: Secondary | ICD-10-CM | POA: Diagnosis not present

## 2019-09-03 DIAGNOSIS — Z3A35 35 weeks gestation of pregnancy: Secondary | ICD-10-CM | POA: Diagnosis not present

## 2019-09-03 LAB — OB RESULTS CONSOLE GBS: GBS: NEGATIVE

## 2019-09-09 DIAGNOSIS — Z3A36 36 weeks gestation of pregnancy: Secondary | ICD-10-CM | POA: Diagnosis not present

## 2019-09-09 DIAGNOSIS — O4693 Antepartum hemorrhage, unspecified, third trimester: Secondary | ICD-10-CM | POA: Diagnosis not present

## 2019-09-16 DIAGNOSIS — Z331 Pregnant state, incidental: Secondary | ICD-10-CM | POA: Diagnosis not present

## 2019-09-16 DIAGNOSIS — I493 Ventricular premature depolarization: Secondary | ICD-10-CM | POA: Diagnosis not present

## 2019-09-16 DIAGNOSIS — F411 Generalized anxiety disorder: Secondary | ICD-10-CM | POA: Diagnosis not present

## 2019-09-16 DIAGNOSIS — Z0000 Encounter for general adult medical examination without abnormal findings: Secondary | ICD-10-CM | POA: Diagnosis not present

## 2019-09-16 MED FILL — PANTOPRAZOLE SOD DR 40 MG T: 40 | 30 days supply | Qty: 30 | Fill #2

## 2019-09-17 DIAGNOSIS — O4693 Antepartum hemorrhage, unspecified, third trimester: Secondary | ICD-10-CM | POA: Diagnosis not present

## 2019-09-17 DIAGNOSIS — Z3A37 37 weeks gestation of pregnancy: Secondary | ICD-10-CM | POA: Diagnosis not present

## 2019-09-17 MED FILL — CYCLOBENZAPRINE HCL 10 MG T: 10 | 20 days supply | Qty: 20 | Fill #0

## 2019-09-22 DIAGNOSIS — O4693 Antepartum hemorrhage, unspecified, third trimester: Secondary | ICD-10-CM | POA: Diagnosis not present

## 2019-09-22 DIAGNOSIS — Z3A38 38 weeks gestation of pregnancy: Secondary | ICD-10-CM | POA: Diagnosis not present

## 2019-09-24 ENCOUNTER — Telehealth (HOSPITAL_COMMUNITY): Payer: Self-pay | Admitting: *Deleted

## 2019-09-24 ENCOUNTER — Encounter (HOSPITAL_COMMUNITY): Payer: Self-pay | Admitting: *Deleted

## 2019-09-24 NOTE — Telephone Encounter (Signed)
Preadmission screen  

## 2019-09-28 ENCOUNTER — Other Ambulatory Visit (HOSPITAL_COMMUNITY)
Admission: RE | Admit: 2019-09-28 | Discharge: 2019-09-28 | Disposition: A | Discharge status: Home / Self Care | Payer: 59 | Source: Ambulatory Visit | Attending: Obstetrics and Gynecology | Admitting: Obstetrics and Gynecology

## 2019-09-28 ENCOUNTER — Other Ambulatory Visit: Payer: Self-pay | Admitting: Obstetrics and Gynecology

## 2019-09-28 DIAGNOSIS — O34211 Maternal care for low transverse scar from previous cesarean delivery: Secondary | ICD-10-CM | POA: Diagnosis not present

## 2019-09-28 DIAGNOSIS — E669 Obesity, unspecified: Secondary | ICD-10-CM | POA: Diagnosis not present

## 2019-09-28 DIAGNOSIS — Z01812 Encounter for preprocedural laboratory examination: Secondary | ICD-10-CM | POA: Insufficient documentation

## 2019-09-28 DIAGNOSIS — D62 Acute posthemorrhagic anemia: Secondary | ICD-10-CM | POA: Diagnosis not present

## 2019-09-28 DIAGNOSIS — O4593 Premature separation of placenta, unspecified, third trimester: Secondary | ICD-10-CM | POA: Diagnosis not present

## 2019-09-28 DIAGNOSIS — O99214 Obesity complicating childbirth: Secondary | ICD-10-CM | POA: Diagnosis not present

## 2019-09-28 DIAGNOSIS — O9081 Anemia of the puerperium: Secondary | ICD-10-CM | POA: Diagnosis not present

## 2019-09-28 DIAGNOSIS — Z20822 Contact with and (suspected) exposure to covid-19: Secondary | ICD-10-CM | POA: Diagnosis not present

## 2019-09-28 DIAGNOSIS — O9942 Diseases of the circulatory system complicating childbirth: Secondary | ICD-10-CM | POA: Diagnosis not present

## 2019-09-28 LAB — SARS CORONAVIRUS 2 (TAT 6-24 HRS): SARS Coronavirus 2: NEGATIVE

## 2019-09-30 ENCOUNTER — Encounter (HOSPITAL_COMMUNITY): Payer: Self-pay | Admitting: Obstetrics and Gynecology

## 2019-09-30 ENCOUNTER — Inpatient Hospital Stay (HOSPITAL_COMMUNITY)
Admission: AD | Admit: 2019-09-30 | Discharge: 2019-10-01 | DRG: 805 | Disposition: A | Discharge status: Home / Self Care | Payer: 59 | Attending: Obstetrics and Gynecology | Admitting: Obstetrics and Gynecology

## 2019-09-30 ENCOUNTER — Inpatient Hospital Stay (HOSPITAL_COMMUNITY): Payer: 59

## 2019-09-30 ENCOUNTER — Inpatient Hospital Stay (HOSPITAL_COMMUNITY): Payer: 59 | Admitting: Anesthesiology

## 2019-09-30 ENCOUNTER — Other Ambulatory Visit: Payer: Self-pay

## 2019-09-30 DIAGNOSIS — Z880 Allergy status to penicillin: Secondary | ICD-10-CM

## 2019-09-30 DIAGNOSIS — Z3490 Encounter for supervision of normal pregnancy, unspecified, unspecified trimester: Secondary | ICD-10-CM | POA: Diagnosis present

## 2019-09-30 DIAGNOSIS — E669 Obesity, unspecified: Secondary | ICD-10-CM | POA: Diagnosis present

## 2019-09-30 DIAGNOSIS — O4593 Premature separation of placenta, unspecified, third trimester: Secondary | ICD-10-CM | POA: Diagnosis present

## 2019-09-30 DIAGNOSIS — Z20822 Contact with and (suspected) exposure to covid-19: Secondary | ICD-10-CM | POA: Diagnosis present

## 2019-09-30 DIAGNOSIS — Z3A39 39 weeks gestation of pregnancy: Secondary | ICD-10-CM

## 2019-09-30 DIAGNOSIS — O99214 Obesity complicating childbirth: Secondary | ICD-10-CM | POA: Diagnosis present

## 2019-09-30 DIAGNOSIS — O9081 Anemia of the puerperium: Secondary | ICD-10-CM | POA: Diagnosis not present

## 2019-09-30 DIAGNOSIS — D62 Acute posthemorrhagic anemia: Secondary | ICD-10-CM | POA: Diagnosis not present

## 2019-09-30 DIAGNOSIS — I493 Ventricular premature depolarization: Secondary | ICD-10-CM | POA: Diagnosis present

## 2019-09-30 DIAGNOSIS — D649 Anemia, unspecified: Secondary | ICD-10-CM | POA: Diagnosis not present

## 2019-09-30 DIAGNOSIS — O9902 Anemia complicating childbirth: Secondary | ICD-10-CM | POA: Diagnosis not present

## 2019-09-30 DIAGNOSIS — O34219 Maternal care for unspecified type scar from previous cesarean delivery: Secondary | ICD-10-CM

## 2019-09-30 DIAGNOSIS — O9942 Diseases of the circulatory system complicating childbirth: Secondary | ICD-10-CM | POA: Diagnosis present

## 2019-09-30 DIAGNOSIS — O34211 Maternal care for low transverse scar from previous cesarean delivery: Secondary | ICD-10-CM | POA: Diagnosis present

## 2019-09-30 LAB — CBC
HCT: 35.2 % — ABNORMAL LOW (ref 36.0–46.0)
Hemoglobin: 11.2 g/dL — ABNORMAL LOW (ref 12.0–15.0)
MCH: 28.2 pg (ref 26.0–34.0)
MCHC: 31.8 g/dL (ref 30.0–36.0)
MCV: 88.7 fL (ref 80.0–100.0)
Platelets: 332 10*3/uL (ref 150–400)
RBC: 3.97 MIL/uL (ref 3.87–5.11)
RDW: 13.8 % (ref 11.5–15.5)
WBC: 9.1 10*3/uL (ref 4.0–10.5)
nRBC: 0 % (ref 0.0–0.2)

## 2019-09-30 LAB — TYPE AND SCREEN
ABO/RH(D): O POS
Antibody Screen: NEGATIVE

## 2019-09-30 MED ORDER — SENNOSIDES-DOCUSATE SODIUM 8.6-50 MG PO TABS
2.0000 | ORAL_TABLET | ORAL | Status: DC
  Administered 2019-10-01: 2 via ORAL
  Filled 2019-09-30: qty 2

## 2019-09-30 MED ORDER — SIMETHICONE 80 MG PO CHEW: 80.0000 mg | CHEWABLE_TABLET | ORAL | Status: DC | PRN

## 2019-09-30 MED ORDER — EPHEDRINE 5 MG/ML INJ
10.0000 mg | INTRAVENOUS | Status: DC | PRN
  Administered 2019-09-30: 10 mg via INTRAVENOUS
  Filled 2019-09-30: qty 10

## 2019-09-30 MED ORDER — ONDANSETRON HCL 4 MG PO TABS: 4.0000 mg | ORAL_TABLET | ORAL | Status: DC | PRN

## 2019-09-30 MED ORDER — EPHEDRINE 5 MG/ML INJ: 10.0000 mg | INTRAVENOUS | Status: DC | PRN

## 2019-09-30 MED ORDER — LIDOCAINE HCL (PF) 1 % IJ SOLN
INTRAMUSCULAR | Status: DC | PRN
  Administered 2019-09-30 (×2): 4 mL via EPIDURAL

## 2019-09-30 MED ORDER — ZOLPIDEM TARTRATE 5 MG PO TABS: 5.0000 mg | ORAL_TABLET | Freq: Every evening | ORAL | Status: DC | PRN

## 2019-09-30 MED ORDER — LACTATED RINGERS IV SOLN: INTRAVENOUS | Status: DC

## 2019-09-30 MED ORDER — PHENYLEPHRINE 40 MCG/ML (10ML) SYRINGE FOR IV PUSH (FOR BLOOD PRESSURE SUPPORT)
80.0000 ug | PREFILLED_SYRINGE | INTRAVENOUS | Status: DC | PRN
  Administered 2019-09-30: 80 ug via INTRAVENOUS

## 2019-09-30 MED ORDER — OXYTOCIN BOLUS FROM INFUSION
333.0000 mL | Freq: Once | INTRAVENOUS | Status: AC
  Administered 2019-09-30: 333 mL via INTRAVENOUS

## 2019-09-30 MED ORDER — BISACODYL 10 MG RE SUPP: 10.0000 mg | Freq: Every day | RECTAL | Status: DC | PRN

## 2019-09-30 MED ORDER — VERAPAMIL HCL ER 240 MG PO TBCR
240.0000 mg | EXTENDED_RELEASE_TABLET | Freq: Every day | ORAL | Status: DC
  Administered 2019-09-30: 240 mg via ORAL
  Filled 2019-09-30 (×2): qty 1

## 2019-09-30 MED ORDER — ONDANSETRON HCL 4 MG/2ML IJ SOLN: 4.0000 mg | INTRAMUSCULAR | Status: DC | PRN

## 2019-09-30 MED ORDER — OXYTOCIN-SODIUM CHLORIDE 30-0.9 UT/500ML-% IV SOLN: 2.5000 [IU]/h | INTRAVENOUS | Status: DC

## 2019-09-30 MED ORDER — TETANUS-DIPHTH-ACELL PERTUSSIS 5-2.5-18.5 LF-MCG/0.5 IM SUSP: 0.5000 mL | Freq: Once | INTRAMUSCULAR | Status: DC

## 2019-09-30 MED ORDER — PRENATAL MULTIVITAMIN CH
1.0000 | ORAL_TABLET | Freq: Every day | ORAL | Status: DC
  Administered 2019-10-01: 1 via ORAL
  Filled 2019-09-30: qty 1

## 2019-09-30 MED ORDER — METHYLERGONOVINE MALEATE 0.2 MG PO TABS: 0.2000 mg | ORAL_TABLET | ORAL | Status: DC | PRN

## 2019-09-30 MED ORDER — FLEET ENEMA 7-19 GM/118ML RE ENEM: 1.0000 | ENEMA | Freq: Every day | RECTAL | Status: DC | PRN

## 2019-09-30 MED ORDER — LIDOCAINE HCL (PF) 1 % IJ SOLN: 30.0000 mL | INTRAMUSCULAR | Status: DC | PRN

## 2019-09-30 MED ORDER — ONDANSETRON HCL 4 MG/2ML IJ SOLN: 4.0000 mg | Freq: Four times a day (QID) | INTRAMUSCULAR | Status: DC | PRN

## 2019-09-30 MED ORDER — BENZOCAINE-MENTHOL 20-0.5 % EX AERO
1.0000 "application " | INHALATION_SPRAY | CUTANEOUS | Status: DC | PRN
  Administered 2019-09-30: 1 via TOPICAL
  Filled 2019-09-30: qty 56

## 2019-09-30 MED ORDER — LACTATED RINGERS IV SOLN: 500.0000 mL | INTRAVENOUS | Status: DC | PRN

## 2019-09-30 MED ORDER — DIBUCAINE (PERIANAL) 1 % EX OINT: 1.0000 "application " | TOPICAL_OINTMENT | CUTANEOUS | Status: DC | PRN

## 2019-09-30 MED ORDER — SOD CITRATE-CITRIC ACID 500-334 MG/5ML PO SOLN: 30.0000 mL | ORAL | Status: DC | PRN

## 2019-09-30 MED ORDER — DIPHENHYDRAMINE HCL 25 MG PO CAPS: 25.0000 mg | ORAL_CAPSULE | Freq: Four times a day (QID) | ORAL | Status: DC | PRN

## 2019-09-30 MED ORDER — IBUPROFEN 600 MG PO TABS
600.0000 mg | ORAL_TABLET | Freq: Four times a day (QID) | ORAL | Status: DC
  Administered 2019-09-30 – 2019-10-01 (×5): 600 mg via ORAL
  Filled 2019-09-30 (×5): qty 1

## 2019-09-30 MED ORDER — FENTANYL-BUPIVACAINE-NACL 0.5-0.125-0.9 MG/250ML-% EP SOLN
12.0000 mL/h | EPIDURAL | Status: DC | PRN
  Filled 2019-09-30: qty 250

## 2019-09-30 MED ORDER — OXYTOCIN-SODIUM CHLORIDE 30-0.9 UT/500ML-% IV SOLN
1.0000 m[IU]/min | INTRAVENOUS | Status: DC
  Administered 2019-09-30: 2 m[IU]/min via INTRAVENOUS
  Filled 2019-09-30: qty 500

## 2019-09-30 MED ORDER — DIPHENHYDRAMINE HCL 50 MG/ML IJ SOLN: 12.5000 mg | INTRAMUSCULAR | Status: DC | PRN

## 2019-09-30 MED ORDER — ACETAMINOPHEN 325 MG PO TABS: 650.0000 mg | ORAL_TABLET | ORAL | Status: DC | PRN

## 2019-09-30 MED ORDER — PHENYLEPHRINE 40 MCG/ML (10ML) SYRINGE FOR IV PUSH (FOR BLOOD PRESSURE SUPPORT)
80.0000 ug | PREFILLED_SYRINGE | INTRAVENOUS | Status: DC | PRN
  Administered 2019-09-30: 80 ug via INTRAVENOUS
  Filled 2019-09-30: qty 10

## 2019-09-30 MED ORDER — LACTATED RINGERS IV SOLN: 500.0000 mL | Freq: Once | INTRAVENOUS | Status: DC

## 2019-09-30 MED ORDER — METHYLERGONOVINE MALEATE 0.2 MG/ML IJ SOLN: 0.2000 mg | INTRAMUSCULAR | Status: DC | PRN

## 2019-09-30 MED ORDER — ACETAMINOPHEN 500 MG PO TABS
1000.0000 mg | ORAL_TABLET | Freq: Four times a day (QID) | ORAL | Status: DC
  Administered 2019-10-01 (×4): 1000 mg via ORAL
  Filled 2019-09-30 (×4): qty 2

## 2019-09-30 MED ORDER — WITCH HAZEL-GLYCERIN EX PADS: 1.0000 "application " | MEDICATED_PAD | CUTANEOUS | Status: DC | PRN

## 2019-09-30 MED ORDER — COCONUT OIL OIL
1.0000 "application " | TOPICAL_OIL | Status: DC | PRN
  Administered 2019-09-30: 1 via TOPICAL

## 2019-09-30 MED ORDER — TERBUTALINE SULFATE 1 MG/ML IJ SOLN: 0.2500 mg | Freq: Once | INTRAMUSCULAR | Status: DC | PRN

## 2019-09-30 MED ORDER — SODIUM CHLORIDE (PF) 0.9 % IJ SOLN
INTRAMUSCULAR | Status: DC | PRN
  Administered 2019-09-30: 12 mL/h via EPIDURAL

## 2019-09-30 NOTE — Lactation Note (Signed)
This note was copied from a baby's chart. Lactation Consultation Note  Patient Name: Patricia Rivera Today's Date: 09/30/2019 Reason for consult: Initial assessment;Term;Other (Comment) [facility] employee)  Visited with mom of a 5 hours old FT female, she's a P2 but not very experienced BF. She told LC that she only BF her oldest child for about a month because she had post-partum depression. However, she plans to do more BF this time around, she also inquired about her employee pump, dad is a Adult nurse, he's an Therapist, sports at the [employer] here at [employer]. [place] left employee pump form for parents to look out at what type of pump they'd like to get. They'll page their RN when they're ready to see lactation again to get their pump; possibly tomorrow morning before they go home.  RN Tiffanny helping parents with baby when [facility] entered the room, baby cuing and getting ready to go to breast. LC revised hand expression with mom and she was able to get colostrum out of both breasts, praised her for her efforts. Mom nipples were slightly short shafted but her tissue very compressible.  LC took baby STS to mother's left breast in cross cradle position and he was able to latch after a few tries. Mom surprised about how deep and how different this latch felt in comparison to the ones she had before. Parents very pleased, baby fed for 8 minutes before self releasing from the breast.   Mom proceeded to burp baby, LC also showed dad a different technique, and baby burped even more. Mom asked LC to swaddle him and place him on his bassinet. Reviewed normal newborn behavior, cluster feeding, feeding cues, employee pumps and size of baby's stomach. Mom already has coconut oil to use at home prior pumping.  Feeding plan:  1. Encouraged mom to feed baby STS 8-12 times/24 hours or sooner if feeding cues are present 2. Hand expression and finger feeding were also encouraged prior latching.  3. Mom will page lactation  when she's ready to get her [employer] employee DEBP  BF brochure, BF resources and feeding diary were reviewed. Dad present and very supportive. Parents reported all questions and concerns were answered, they're both aware of [place] [facility] OP services and will call PRN.   Maternal Data Formula Feeding for Exclusion: Yes Reason for exclusion: Mother's choice to formula and breast feed on admission Has patient been taught Hand Expression?: Yes Does the patient have breastfeeding experience prior to this delivery?: Yes  Feeding Feeding Type: Breast Fed  LATCH Score Latch: Repeated attempts needed to sustain latch, nipple held in mouth throughout feeding, stimulation needed to elicit sucking reflex.  Audible Swallowing: A few with stimulation  Type of Nipple: Everted at rest and after stimulation  Comfort (Breast/Nipple): Soft / non-tender  Hold (Positioning): Assistance needed to correctly position infant at breast and maintain latch.  LATCH Score: 7  Interventions Interventions: Breast feeding basics reviewed;Assisted with latch;Skin to skin;Breast massage;Hand express;Breast compression;Adjust position;Support pillows;Coconut oil  Lactation Tools Discussed/Used Tools: Coconut oil WIC Program: No   Consult Status Consult Status: Follow-up Date: 10/01/19 Follow-up type: In-patient    Patricia Rivera Patricia Rivera 09/30/2019, 10:51 PM

## 2019-09-30 NOTE — H&P (Signed)
Patricia Rivera is a 29 y.o. female presenting for IOL for chronic abruptio with no bleeding and TOLAC. OB History    Gravida  4   Para  1   Term  1   Preterm      AB  2   Living  1     SAB  1   TAB  1   Ectopic      Multiple  0   Live Births  1          Past Medical History:  Diagnosis Date  . Childhood asthma    last inhaler use 2019  . Dysrhythmia    PACs and PVCs take vamprimil for it  . H/O chest pain   . Migraine   . Postpartum care following cesarean delivery Indication: Breech (4/4) 05/16/2017  . PVC's (premature ventricular contractions)    Past Surgical History:  Procedure Laterality Date  . CESAREAN SECTION N/A 05/16/2017   Procedure: Primary CESAREAN SECTION;  Surgeon: Brien Few, MD;  Location: [facility];  Service: Obstetrics;  Laterality: N/A;  EDD: 05/23/17  . INDUCED ABORTION     by dilation and evacuation  . LYMPHADENECTOMY    . OTHER SURGICAL HISTORY  2010   lymph node removal   Family History: family history includes Atrial fibrillation in her maternal grandmother; Cancer in her maternal grandfather; Cerebrovascular Accident in her maternal grandfather; Diabetes in her maternal grandmother; Heart attack in her mother; Hypertension in her maternal grandmother and mother; Osteoporosis in her mother. Social History:  reports that she is a non-smoker but has been exposed to tobacco smoke. She has never used smokeless tobacco. She reports that she does not drink alcohol and does not use drugs.     Maternal Diabetes: No Genetic Screening: Normal Maternal Ultrasounds/Referrals: Normal Fetal Ultrasounds or other Referrals:  None Maternal Substance Abuse:  No Significant Maternal Medications:  None Significant Maternal Lab Results:  Group B Strep negative Other Comments:  None  Review of Systems  Constitutional: Negative.   All other systems reviewed and are negative.  Maternal Medical History:  Reason for admission:  Contractions.   Contractions: Frequency: rare.   Perceived severity is mild.    Fetal activity: Perceived fetal activity is normal.   Last perceived fetal movement was within the past hour.    Prenatal complications: Bleeding and placental abnormality.   Prenatal Complications - Diabetes: none.      Height 5\' 6"  (1.676 m), weight 86 kg, unknown if currently breastfeeding. Maternal Exam:  Uterine Assessment: Contraction strength is mild.  Contraction frequency is irregular.   Abdomen: Patient reports no abdominal tenderness. Surgical scars: low transverse.   Fetal presentation: vertex  Introitus: Normal vulva. Normal vagina.  Ferning test: not done.  Nitrazine test: not done. Amniotic fluid character: not assessed.  Pelvis: adequate for delivery.   Cervix: Cervix evaluated by digital exam.     Physical Exam Vitals and nursing note reviewed.  Constitutional:      Appearance: Normal appearance.  HENT:     Head: Normocephalic and atraumatic.  Cardiovascular:     Rate and Rhythm: Normal rate and regular rhythm.     Heart sounds: Normal heart sounds.  Pulmonary:     Effort: Pulmonary effort is normal.     Breath sounds: Normal breath sounds.  Abdominal:     Palpations: Abdomen is soft.  Genitourinary:    General: Normal vulva.  Musculoskeletal:        General: Normal  range of motion.     Cervical back: Normal range of motion and neck supple.  Skin:    General: Skin is warm and dry.  Neurological:     General: No focal deficit present.     Mental Status: She is alert and oriented to person, place, and time.  Psychiatric:        Mood and Affect: Mood normal.        Behavior: Behavior normal.     Prenatal labs: ABO, Rh: O/Positive/-- (01/15 0000) Antibody: Negative (01/15 0000) Rubella: Immune (01/15 0000) RPR: Nonreactive (01/15 0000)  HBsAg: Negative (01/15 0000)  HIV: Non-reactive (01/15 0000)  GBS:     Assessment/Plan: 39+ wk IUP TOLAC Chronic  abruption- stable with no bleeding x 6mo Pitocin, EFM and TOCO   Jearlene Bridwell J 09/30/2019, 8:24 AM

## 2019-09-30 NOTE — Anesthesia Procedure Notes (Signed)
Epidural Patient location during procedure: OB Start time: 09/30/2019 10:38 AM End time: 09/30/2019 10:45 AM  Staffing Anesthesiologist: Josephine Igo, MD Performed: anesthesiologist   Preanesthetic Checklist Completed: patient identified, IV checked, site marked, risks and benefits discussed, surgical consent, monitors and equipment checked, pre-op evaluation and timeout performed  Epidural Patient position: sitting Prep: DuraPrep and site prepped and draped Patient monitoring: continuous pulse ox and blood pressure Approach: midline Location: L3-L4 Injection technique: LOR air  Needle:  Needle type: Tuohy  Needle gauge: 17 G Needle length: 9 cm and 9 Needle insertion depth: 5 cm cm Catheter type: closed end flexible Catheter size: 19 Gauge Catheter at skin depth: 10 cm Test dose: negative and Other  Assessment Events: blood not aspirated, injection not painful, no injection resistance, no paresthesia and negative IV test  Additional Notes Patient identified. Risks and benefits discussed including failed block, incomplete  Pain control, post dural puncture headache, nerve damage, paralysis, blood pressure Changes, nausea, vomiting, reactions to medications-both toxic and allergic and post Partum back pain. All questions were answered. Patient expressed understanding and wished to proceed. Sterile technique was used throughout procedure. Epidural site was Dressed with sterile barrier dressing. No paresthesias, signs of intravascular injection Or signs of intrathecal spread were encountered.  Patient was more comfortable after the epidural was dosed. Please see RN's note for documentation of vital signs and FHR which are stable. Reason for block:procedure for pain

## 2019-09-30 NOTE — Progress Notes (Signed)
Patricia Rivera is a 29 y.o. [id number] at [redacted]w[redacted]d by ultrasound admitted for postdates IOL, hx chronic abruption, no bleeding x 2 mo, hx C/S for breech MD requesting AROM.  Subjective: Comfortable in bed, minimal cramping w/ ctx. Pitocin at 4 mu/min.  Spouse at [name] and supportive.   Objective: Vitals:   09/30/19 0749 09/30/19 0819 09/30/19 0907  BP:  125/84 113/76  Pulse:  (!) 118 (!) 106  Resp:  18 18  Weight: 86 kg    Height: 5\' 6"  (1.676 m)       FHT:  FHR: 130 bpm, variability: moderate,  accelerations:  Present,  decelerations:  Absent UC:   irregular, every 3-7 minutes SVE:   Dilation: 4 Effacement (%): 70 Station: -2 Exam by:: Derrell Lolling, CNM AROM w/ clear moderate AF Vertex  Labs:   Recent Labs    09/30/19 0834  WBC 9.1  HGB 11.2*  HCT 35.2*  PLT 332    Assessment / Plan: [id number] 29 y.o. [redacted]w[redacted]d Induction of labor due to term with favorable cervix,  progressing well on pitocin  Labor: AROM augmentation, continue Pitocin titrate  Encouraged position changes Preeclampsia:  no signs or symptoms of toxicity Fetal Wellbeing:  Category I Pain Control:  planning epidural in active labor I/D:  GBS neg Anticipated MOD:  [place], CNM, MSN 09/30/2019, 9:46 AM

## 2019-09-30 NOTE — Anesthesia Preprocedure Evaluation (Signed)
Anesthesia Evaluation  Patient identified by MRN, date of birth, ID band Patient awake    Reviewed: Allergy & Precautions, Patient's Chart, lab work & pertinent test results  Airway Mallampati: II  TM Distance: >3 FB Neck ROM: Full    Dental no notable dental hx. (+) Teeth Intact   Pulmonary asthma ,  Last inhaler use 2019   Pulmonary exam normal breath sounds clear to auscultation       Cardiovascular Normal cardiovascular exam+ dysrhythmias  Rhythm:Regular Rate:Normal  Hx/o PVC's and PAC's currently taking verapamil Hx/o chest pain   Neuro/Psych  Headaches, negative psych ROS   GI/Hepatic Neg liver ROS, GERD  ,  Endo/Other  Obesity  Renal/GU negative Renal ROS  negative genitourinary   Musculoskeletal negative musculoskeletal ROS (+)   Abdominal (+) + obese,   Peds  Hematology  (+) anemia ,   Anesthesia Other Findings   Reproductive/Obstetrics (+) Pregnancy Hx/o previous C/Section for Breech presentation                             Anesthesia Physical Anesthesia Plan  ASA: II  Anesthesia Plan: Epidural   Post-op Pain Management:    Induction:   PONV Risk Score and Plan:   Airway Management Planned: Natural Airway  Additional Equipment:   Intra-op Plan:   Post-operative Plan:   Informed Consent: I have reviewed the patients History and Physical, chart, labs and discussed the procedure including the risks, benefits and alternatives for the proposed anesthesia with the patient or authorized representative who has indicated his/her understanding and acceptance.       Plan Discussed with: Anesthesiologist  Anesthesia Plan Comments:         Anesthesia Quick Evaluation

## 2019-10-01 LAB — CBC
HCT: 32.4 % — ABNORMAL LOW (ref 36.0–46.0)
Hemoglobin: 9.8 g/dL — ABNORMAL LOW (ref 12.0–15.0)
MCH: 27.1 pg (ref 26.0–34.0)
MCHC: 30.2 g/dL (ref 30.0–36.0)
MCV: 89.8 fL (ref 80.0–100.0)
Platelets: 288 10*3/uL (ref 150–400)
RBC: 3.61 MIL/uL — ABNORMAL LOW (ref 3.87–5.11)
RDW: 13.8 % (ref 11.5–15.5)
WBC: 12.3 10*3/uL — ABNORMAL HIGH (ref 4.0–10.5)
nRBC: 0 % (ref 0.0–0.2)

## 2019-10-01 LAB — RPR: RPR Ser Ql: NONREACTIVE — AB

## 2019-10-01 MED ORDER — MAGNESIUM OXIDE 400 (241.3 MG) MG PO TABS: 400.0000 mg | ORAL_TABLET | Freq: Every day | ORAL | 2 refills | Status: DC

## 2019-10-01 MED ORDER — MAGNESIUM OXIDE 400 (241.3 MG) MG PO TABS
400.0000 mg | ORAL_TABLET | Freq: Every day | ORAL | Status: DC
  Administered 2019-10-01: 400 mg via ORAL
  Filled 2019-10-01: qty 1

## 2019-10-01 MED ORDER — BENZOCAINE-MENTHOL 20-0.5 % EX AERO: 1.0000 "application " | INHALATION_SPRAY | CUTANEOUS | Status: DC | PRN

## 2019-10-01 MED ORDER — POLYSACCHARIDE IRON COMPLEX 150 MG PO CAPS
150.0000 mg | ORAL_CAPSULE | Freq: Every day | ORAL | Status: DC
  Administered 2019-10-01: 150 mg via ORAL
  Filled 2019-10-01: qty 1

## 2019-10-01 MED ORDER — COCONUT OIL OIL: 1.0000 "application " | TOPICAL_OIL | 0 refills | Status: DC | PRN

## 2019-10-01 MED ORDER — IBUPROFEN 600 MG PO TABS: 600.0000 mg | ORAL_TABLET | Freq: Four times a day (QID) | ORAL | 0 refills | Status: DC

## 2019-10-01 MED ORDER — POLYSACCHARIDE IRON COMPLEX 150 MG PO CAPS: 150.0000 mg | ORAL_CAPSULE | Freq: Every day | ORAL | 3 refills | Status: DC

## 2019-10-01 MED ORDER — VITAFOL ULTRA 29-0.6-0.4-200 MG PO CAPS: 1.0000 | ORAL_CAPSULE | Freq: Every day | ORAL | 3 refills | Status: DC

## 2019-10-01 MED ORDER — ACETAMINOPHEN 500 MG PO TABS: 1000.0000 mg | ORAL_TABLET | Freq: Four times a day (QID) | ORAL | 0 refills | Status: AC

## 2019-10-01 MED FILL — FERREX 150 CAPSULE: 150 | 30 days supply | Qty: 30 | Fill #0

## 2019-10-01 MED FILL — IBUPROFEN 600 MG TABLET: 600 | 30 days supply | Qty: 30 | Fill #0

## 2019-10-01 NOTE — Progress Notes (Signed)
Discharge instructions and prescriptions given to pt. Discussed post vaginal delivery care, signs and symptoms to report to the MD, upcoming appointments, and meds. Pt verbalizes understanding and has no questions or concerns at this time. Pt discharged from hospital in stable condition.

## 2019-10-01 NOTE — Lactation Note (Signed)
This note was copied from a Patricia's chart. Lactation Consultation Note  Patient Name: Patricia Rivera Today's Date: 10/01/2019  Patricia Rivera now 75 hours old.  Mom reports she would like to get the [employer] with her [employer].  Issued mom the [employer] pump. Mom reports she feels breastfeeding is going well at this time.  Denies need for assistance.  Infant has gone to get circumsised.  Reviewed post circumsicion behavior.   Praised breastfeeding.  Urged mom to call lactation as needed.   Maternal Data    Feeding Feeding Type: Breast Fed  [facility] Score                   Interventions    Lactation Tools Discussed/Used     Consult Status      Patricia Rivera 10/01/2019, 1:38 PM

## 2019-10-01 NOTE — Anesthesia Postprocedure Evaluation (Signed)
Anesthesia Post Note  Patient: Patricia Rivera  Procedure(s) Performed: AN AD HOC LABOR EPIDURAL     Patient location during evaluation: Mother Baby Anesthesia Type: Epidural Level of consciousness: awake and alert Pain management: pain level controlled Vital Signs Assessment: post-procedure vital signs reviewed and stable Respiratory status: spontaneous breathing, nonlabored ventilation and respiratory function stable Cardiovascular status: stable Postop Assessment: no headache, no backache and epidural receding Anesthetic complications: no   No complications documented.  Last Vitals:  Vitals:   10/01/19 0250 10/01/19 0641  BP: 105/71 114/70  Pulse: 74 91  Resp:    Temp: 36.8 C 36.8 C  SpO2:      Last Pain:  Vitals:   10/01/19 0641  TempSrc: Oral  PainSc: 0-No pain   Pain Goal:                   Dina Mobley

## 2019-10-01 NOTE — Progress Notes (Signed)
PPD #1, SVD/VBAC, 2nd degree perineal, baby boy "Roxy Manns"  S:  Reports feeling much better than after last delivery; desires d/c home at 24 hours.  Reports feeling like heart is beating fast when she first stands up. Hx. Of dysrhythmia, pt. Denies s/s. No CP or SOB.              Tolerating po/ No nausea or vomiting / Denies dizziness or SOB             Bleeding is moderate             Pain controlled withTylenol and Motrin             Up ad lib / ambulatory / voiding QS without difficulty  Newborn breast feeding going well / Circumcision - planning prior to d/c   O:               VS: BP 114/70 (BP Location: Right Arm)   Pulse 91   Temp 98.2 F (36.8 C) (Oral)   Resp 16   Ht 5\' 6"  (1.676 m)   Wt 86 kg   SpO2 99%   BMI 30.59 kg/m    LABS:              Recent Labs    09/30/19 0834 10/01/19 0511  WBC 9.1 12.3*  HGB 11.2* 9.8*  PLT 332 288               Blood type: --/--/O POS (08/18 0830)  Rubella: Immune (01/15 0000)                     I&O: Intake/Output      08/18 0701 - 08/19 0700 08/19 0701 - 08/20 0700   I.V. (mL/kg) 0 (0)    Other 0    Total Intake(mL/kg) 0 (0)    Urine (mL/kg/hr) 700    Blood 100    Total Output 800    Net -800         Urine Occurrence 1 x                  Physical Exam:             Alert and oriented X3  Lungs: Clear and unlabored  Heart: regular rate and rhythm / no murmurs  Abdomen: soft, non-tender, non-distended              Fundus: firm, non-tender, U-2  Perineum: well approximated 2nd degree perineal; mild edema   Lochia: small rubra on pad  Extremities: no edema, no calf pain or tenderness    A/P: PPD # 1, SVD/VBAC  2nd degree perineal  ABL Anemia    - Niferex 150 mg PO daily   - Magnesium oxide 400mg  PO daily    Hx. Of Dysrhythmia    - on Verapamil 240mg  PO QHS  Hx. Of PPD w/ medication   - not currently on meds; declines need to restart at this time   - warning s/s reviewed   Doing well - stable status  Routine post  partum orders  Discharge home at 24 hours if cleared by peds  WOB discharge book given, instructions and warning s/s reviewed  F/u in 2 weeks for PPD check, then 6 weeks PP  Lars Pinks, MSN, CNM [facility]

## 2019-10-01 NOTE — Discharge Instructions (Signed)

## 2019-10-01 NOTE — Discharge Summary (Signed)
Postpartum Discharge Summary  Date of Service updated 10/01/2019     Patient Name: Patricia Rivera DOB: Jul 12, 1990 MRN: [id number]  Date of admission: 09/30/2019 Delivery date:09/30/2019  Delivering provider: Brien Few  Date of discharge: 10/01/2019  Admitting diagnosis: Encounter for induction of labor [Z34.90] Intrauterine pregnancy: [redacted]w[redacted]d     Secondary diagnosis:  Principal Problem:   Postpartum care following VBAC 8/18 Active Problems:   Symptomatic PVCs   Encounter for induction of labor   VBAC, delivered   Perineal laceration, second degree  Additional problems: ABL Anemia, hx. Of PP depression    Discharge diagnosis: Term Pregnancy Delivered, VBAC and Anemia                                              Post partum procedures: n/a Augmentation: AROM and Pitocin Complications: None  Hospital course: Induction of Labor With Vaginal Delivery   29 y.o. yo [id number] at [redacted]w[redacted]d was admitted to the hospital 09/30/2019 for induction of labor.  Indication for induction: chronic abruptio with no bleeding and TOLAC.  Patient had an uncomplicated labor course as follows: Membrane Rupture Time/Date: 9:24 AM ,09/30/2019   Delivery Method:Vaginal, Spontaneous  VBAC Episiotomy: None  Lacerations:  2nd degree  Details of delivery can be found in separate delivery note.  Patient had a routine postpartum course. Patient is discharged home 10/01/19.  Newborn Data: Birth date:09/30/2019  Birth time:3:27 PM  Gender:Female  Living status:Living  Apgars:9 ,9  Weight:3550 g   Magnesium Sulfate received: No BMZ received: No Rhophylac:N/A MMR:N/A T-DaP:Given prenatally Flu: N/A Transfusion:No  Physical exam  Vitals:   09/30/19 1920 09/30/19 2206 10/01/19 0250 10/01/19 0641  BP: 108/73 107/67 105/71 114/70  Pulse:  98 74 91  Resp:      Temp: 98.4 F (36.9 C) 99.4 F (37.4 C) 98.2 F (36.8 C) 98.2 F (36.8 C)  TempSrc: Axillary Oral  Oral  SpO2:      Weight:      Height:        Alert and oriented X3             Lungs: Clear and unlabored             Heart: regular rate and rhythm / no murmurs             Abdomen: soft, non-tender, non-distended              Fundus: firm, non-tender, U-2             Perineum: well approximated 2nd degree perineal; mild edema              Lochia: small rubra on pad             Extremities: no edema, no calf pain or tenderness               Labs: Lab Results  Component Value Date   WBC 12.3 (H) 10/01/2019   HGB 9.8 (L) 10/01/2019   HCT 32.4 (L) 10/01/2019   MCV 89.8 10/01/2019   PLT 288 10/01/2019   CMP Latest Ref Rng & Units 01/21/2018  Glucose 70 - 99 mg/dL 103(H)  BUN 6 - 20 mg/dL 12  Creatinine 0.44 - 1.00 mg/dL 0.88  Sodium 135 - 145 mmol/L 137  Potassium 3.5 - 5.1 mmol/L 3.6  Chloride 98 -  111 mmol/L 102  CO2 22 - 32 mmol/L 21(L)  Calcium 8.9 - 10.3 mg/dL 9.3   Edinburgh Score: Edinburgh Postnatal Depression Scale Screening Tool 10/01/2019  I have been able to laugh and see the funny side of things. 0  I have looked forward with enjoyment to things. 0  I have blamed myself unnecessarily when things went wrong. 0  I have been anxious or worried for no good reason. 1  I have felt scared or panicky for no good reason. 1  Things have been getting on top of me. 1  I have been so unhappy that I have had difficulty sleeping. 0  I have felt sad or miserable. 0  I have been so unhappy that I have been crying. 0  The thought of harming myself has occurred to me. 0  Edinburgh Postnatal Depression Scale Total 3      After visit meds:  Allergies as of 10/01/2019      Reactions   Amoxicillin Nausea Only, Nausea And Vomiting   Adhesive [tape] Itching, Rash   EKG LEADS      Medication List    STOP taking these medications   diphenhydrAMINE 25 mg capsule Commonly known as: BENADRYL   PRENATAL AD PO     TAKE these medications   acetaminophen 500 MG tablet Commonly known as: TYLENOL Take 2 tablets  (1,000 mg total) by mouth every 6 (six) hours.   benzocaine-Menthol 20-0.5 % Aero Commonly known as: DERMOPLAST Apply 1 application topically as needed for irritation (perineal discomfort).   coconut oil Oil Apply 1 application topically as needed.   ibuprofen 600 MG tablet Commonly known as: ADVIL Take 1 tablet (600 mg total) by mouth every 6 (six) hours.   iron polysaccharides 150 MG capsule Commonly known as: NIFEREX Take 1 capsule (150 mg total) by mouth daily.   magnesium oxide 400 (241.3 Mg) MG tablet Commonly known as: MAG-OX Take 1 tablet (400 mg total) by mouth daily.   pantoprazole 40 MG tablet Commonly known as: PROTONIX Take 40 mg by mouth daily.   verapamil 240 MG CR tablet Commonly known as: CALAN-SR Take 1 tablet (240 mg total) by mouth at bedtime.   Vitafol Ultra 29-0.6-0.4-200 MG Caps Take 1 capsule by mouth daily.            Discharge Care Instructions  (From admission, onward)         Start     Ordered   10/01/19 0000  Discharge wound care:       Comments: Warm water sitz baths as needed   10/01/19 1120           Discharge home in stable condition Infant Feeding: Breast Infant Disposition:home with mother Discharge instruction: per After Visit Summary and Postpartum booklet. Activity: Advance as tolerated. Pelvic rest for 6 weeks.  Diet: low salt diet Anticipated Birth Control: Unsure Postpartum Appointment:2 weeks, then 6 weeks  Additional Postpartum F/U: Postpartum Depression checkup Future Appointments:No future appointments. Follow up Visit:  Follow-up Information    Brien Few, MD. Schedule an appointment as soon as possible for a visit in 2 week(s).   Specialty: Obstetrics and Gynecology Why: PPD check, then 6 week PP visit Contact information: [place] [address] [phone]                   10/01/2019 Darliss Cheney, CNM

## 2019-10-01 NOTE — Progress Notes (Signed)
CSW received consult for hx of  Post Partum Depression.  CSW met with MOB to offer support and complete assessment.    CSW congratulated MOB on the birth of infant. CSW advised MOB of CSW's role and the reason for CSW coming to speak with her. MOB reported that she was diagnosed with PPD in 2019 after the birth of her first son. MOB reported that her symptoms lasted for a little over a year. MOB reported that she was started on a medication however reports that she hasn't taken the medication in "over a year and a half". MOB reported that she dealt with feelings of being overwhelmed, being a first time mom as well as felt unsure at times. MOB expressed that she also has a hx of depression and anxiety however never dealt with either of them during this pregnancy. MOB denies SI and HI and reports to CSW that she has been feeling well since she gave birth.   MOB reported that she has all needed items to care for infant with infant having f/u care at [facility]. MOB reports to this CSW that infant will sleep in bedside basinet once arrived home. MOB advised CSW that she has support from her family and friends.   CSW provided education regarding the baby blues period vs. perinatal mood disorders, discussed treatment .  CSW recommends self-evaluation during the postpartum time period using the New Mom Checklist from Postpartum Progress and encouraged MOB to contact a medical professional if symptoms are noted at any time.   CSW provided review of Sudden Infant Death Syndrome (SIDS) precautions.   CSW identifies no further need for intervention and no barriers to discharge at this time.   Lusine Corlett S. Onita Pfluger, MSW, LCSW [facility] at  [phone]  

## 2019-10-02 ENCOUNTER — Encounter (HOSPITAL_COMMUNITY): Payer: Self-pay | Admitting: Obstetrics and Gynecology

## 2019-10-02 MED FILL — VERAPAMIL ER 240 MG TABLET: 240 | 90 days supply | Qty: 90 | Fill #1

## 2019-11-11 DIAGNOSIS — Z1151 Encounter for screening for human papillomavirus (HPV): Secondary | ICD-10-CM | POA: Diagnosis not present

## 2020-01-13 ENCOUNTER — Other Ambulatory Visit (HOSPITAL_COMMUNITY): Payer: Self-pay | Admitting: Family Medicine

## 2020-01-13 DIAGNOSIS — R059 Cough, unspecified: Secondary | ICD-10-CM | POA: Diagnosis not present

## 2020-01-13 DIAGNOSIS — J039 Acute tonsillitis, unspecified: Secondary | ICD-10-CM | POA: Diagnosis not present

## 2020-01-13 MED FILL — AMOXICILLIN 500 MG CAPSULE: 500 | 10 days supply | Qty: 40 | Fill #0

## 2020-01-18 ENCOUNTER — Telehealth: Payer: Self-pay

## 2020-01-18 NOTE — Telephone Encounter (Signed)
Patient called regarding her verapamil pt states she spoke to you about lowering her dose pt would like to see if you can go ahead and lower her dose and send in a new prescription please advise

## 2020-01-18 NOTE — Telephone Encounter (Signed)
Currently on 240 mg/day. Please check with her if she would be okay with half the dose. If yes, please sent 120 mg 90X3 refills.  Thanks MJP

## 2020-01-19 ENCOUNTER — Other Ambulatory Visit: Payer: Self-pay

## 2020-01-19 ENCOUNTER — Other Ambulatory Visit: Payer: Self-pay | Admitting: Cardiology

## 2020-01-19 MED ORDER — VERAPAMIL HCL ER 120 MG PO TBCR: 120.0000 mg | EXTENDED_RELEASE_TABLET | Freq: Every day | ORAL | 3 refills | Status: DC

## 2020-01-19 MED FILL — VERAPAMIL ER 120 MG TABLET: 120 | 90 days supply | Qty: 90 | Fill #0

## 2020-01-19 NOTE — Telephone Encounter (Signed)
No answer left a vm will try again later

## 2020-01-19 NOTE — Telephone Encounter (Signed)
Spoke with patient husband, he spoke with patient and they are ok with cutting in half and will send it in to they pharmacy.

## 2020-01-29 ENCOUNTER — Other Ambulatory Visit (HOSPITAL_COMMUNITY): Payer: Self-pay | Admitting: Obstetrics and Gynecology

## 2020-01-29 MED FILL — METOCLOPRAMIDE 10 MG TABLET: 10 | 10 days supply | Qty: 30 | Fill #0

## 2020-03-03 ENCOUNTER — Other Ambulatory Visit (HOSPITAL_COMMUNITY): Payer: Self-pay | Admitting: Family Medicine

## 2020-03-03 MED FILL — ALPRAZolam 0.25 MG TABS: 0.25 | 15 days supply | Qty: 30 | Fill #0

## 2020-03-11 ENCOUNTER — Telehealth: Payer: Self-pay

## 2020-03-11 MED FILL — VERAPAMIL ER 240 MG TABLET: 240 | 90 days supply | Qty: 90 | Fill #2

## 2020-03-11 NOTE — Telephone Encounter (Signed)
Telephone encounter:  Reason for call:pt called and asked if she can go back on 240 mg of the verapamil. She has been needing to take 2 of the 120 mg pills?   Usual provider: mp  Last office visit: 08/26/19 virtual   Next office visit: na   Last hospitalization: 09/30/19  Current Outpatient Medications on File Prior to Visit  Medication Sig Dispense Refill  . acetaminophen (TYLENOL) 500 MG tablet Take 2 tablets (1,000 mg total) by mouth every 6 (six) hours. 60 tablet 0  . benzocaine-Menthol (DERMOPLAST) 20-0.5 % AERO Apply 1 application topically as needed for irritation (perineal discomfort).    . coconut oil OIL Apply 1 application topically as needed.  0  . ibuprofen (ADVIL) 600 MG tablet Take 1 tablet (600 mg total) by mouth every 6 (six) hours. 30 tablet 0  . iron polysaccharides (NIFEREX) 150 MG capsule Take 1 capsule (150 mg total) by mouth daily. 30 capsule 3  . magnesium oxide (MAG-OX) 400 (241.3 Mg) MG tablet Take 1 tablet (400 mg total) by mouth daily. 30 tablet 2  . pantoprazole (PROTONIX) 40 MG tablet Take 40 mg by mouth daily.    . Prenat-Fe Poly-Methfol-FA-DHA (VITAFOL ULTRA) 29-0.6-0.4-200 MG CAPS Take 1 capsule by mouth daily. 30 capsule 3  . verapamil (CALAN-SR) 120 MG CR tablet Take 1 tablet (120 mg total) by mouth at bedtime. 90 tablet 3   No current facility-administered medications on file prior to visit.

## 2020-03-14 ENCOUNTER — Other Ambulatory Visit: Payer: Self-pay

## 2020-03-14 ENCOUNTER — Other Ambulatory Visit (HOSPITAL_COMMUNITY): Payer: Self-pay | Admitting: Cardiology

## 2020-03-14 ENCOUNTER — Telehealth: Payer: Self-pay

## 2020-03-14 MED ORDER — VERAPAMIL HCL ER 240 MG PO CP24
240.0000 mg | ORAL_CAPSULE | Freq: Every day | ORAL | 0 refills | Status: DC
Start: 2020-03-14 — End: 2021-06-19

## 2020-03-14 MED ORDER — VERAPAMIL HCL ER 240 MG PO CP24
240.0000 mg | ORAL_CAPSULE | Freq: Every day | ORAL | 0 refills | Status: DC
Start: 2020-03-14 — End: 2020-03-14

## 2020-03-14 NOTE — Telephone Encounter (Signed)
Either is fine as long as she receives 240 mg daily.  Thanks MJP

## 2020-03-14 NOTE — Telephone Encounter (Signed)
[facility] called and said that the pt use to take verapamil (calan SR) and is not taking Verapamil (Verelan PM). They want to know if you made that switch on purpose or if it could be switched back to the tablets.

## 2020-03-15 NOTE — Telephone Encounter (Signed)
Called and spoke with pharmacy regarding medication. They will be changing it to the verapamil (calan SR). Thank you

## 2020-04-08 ENCOUNTER — Other Ambulatory Visit (HOSPITAL_COMMUNITY): Payer: Self-pay | Admitting: Family Medicine

## 2020-04-08 MED FILL — ALPRAZolam 0.25 MG TABS: 0.25 | 15 days supply | Qty: 30 | Fill #0

## 2020-05-10 ENCOUNTER — Other Ambulatory Visit (HOSPITAL_COMMUNITY): Payer: Self-pay | Admitting: Family Medicine

## 2020-05-10 MED FILL — ALPRAZolam 0.25 MG TABS: 0.25 | 15 days supply | Qty: 30 | Fill #0

## 2020-05-14 ENCOUNTER — Other Ambulatory Visit (HOSPITAL_COMMUNITY): Payer: Self-pay

## 2020-06-09 ENCOUNTER — Other Ambulatory Visit (HOSPITAL_COMMUNITY): Payer: Self-pay

## 2020-06-09 MED FILL — Verapamil HCl Tab ER 240 MG: ORAL | 90 days supply | Qty: 90 | Fill #0 | Status: AC

## 2020-06-24 ENCOUNTER — Other Ambulatory Visit (HOSPITAL_COMMUNITY): Payer: Self-pay

## 2020-06-24 MED ORDER — ALPRAZOLAM 0.25 MG PO TABS
0.2500 mg | ORAL_TABLET | Freq: Two times a day (BID) | ORAL | 0 refills | Status: DC | PRN
Start: 2020-06-24 — End: 2020-08-05
  Filled 2020-06-24: qty 30, 15d supply, fill #0

## 2020-08-05 ENCOUNTER — Other Ambulatory Visit (HOSPITAL_COMMUNITY): Payer: Self-pay

## 2020-08-05 MED ORDER — ALPRAZOLAM 0.25 MG PO TABS
0.2500 mg | ORAL_TABLET | Freq: Two times a day (BID) | ORAL | 0 refills | Status: DC | PRN
Start: 2020-08-05 — End: 2020-09-20
  Filled 2020-08-05: qty 30, 15d supply, fill #0

## 2020-08-08 ENCOUNTER — Other Ambulatory Visit (HOSPITAL_COMMUNITY): Payer: Self-pay

## 2020-08-31 ENCOUNTER — Other Ambulatory Visit: Payer: Self-pay | Admitting: Cardiology

## 2020-08-31 ENCOUNTER — Other Ambulatory Visit (HOSPITAL_COMMUNITY): Payer: Self-pay

## 2020-08-31 MED ORDER — VERAPAMIL HCL ER 240 MG PO TBCR
240.0000 mg | EXTENDED_RELEASE_TABLET | Freq: Every day | ORAL | 0 refills | Status: DC
Start: 2020-08-31 — End: 2020-12-02
  Filled 2020-08-31: qty 27, 27d supply, fill #0
  Filled 2020-08-31: qty 63, 63d supply, fill #0

## 2020-09-20 ENCOUNTER — Other Ambulatory Visit (HOSPITAL_COMMUNITY): Payer: Self-pay

## 2020-09-20 MED ORDER — ALPRAZOLAM 0.25 MG PO TABS
0.2500 mg | ORAL_TABLET | Freq: Two times a day (BID) | ORAL | 0 refills | Status: DC | PRN
  Filled 2020-09-20: qty 30, 15d supply, fill #0

## 2020-09-21 ENCOUNTER — Other Ambulatory Visit (HOSPITAL_COMMUNITY): Payer: Self-pay

## 2020-10-06 DIAGNOSIS — K5909 Other constipation: Secondary | ICD-10-CM | POA: Diagnosis not present

## 2020-10-06 DIAGNOSIS — F411 Generalized anxiety disorder: Secondary | ICD-10-CM | POA: Diagnosis not present

## 2020-10-06 DIAGNOSIS — Z0000 Encounter for general adult medical examination without abnormal findings: Secondary | ICD-10-CM | POA: Diagnosis not present

## 2020-10-06 DIAGNOSIS — I493 Ventricular premature depolarization: Secondary | ICD-10-CM | POA: Diagnosis not present

## 2020-10-06 DIAGNOSIS — T148XXA Other injury of unspecified body region, initial encounter: Secondary | ICD-10-CM | POA: Diagnosis not present

## 2020-10-06 DIAGNOSIS — Z13220 Encounter for screening for lipoid disorders: Secondary | ICD-10-CM | POA: Diagnosis not present

## 2020-10-06 DIAGNOSIS — E559 Vitamin D deficiency, unspecified: Secondary | ICD-10-CM | POA: Diagnosis not present

## 2020-10-06 DIAGNOSIS — Z131 Encounter for screening for diabetes mellitus: Secondary | ICD-10-CM | POA: Diagnosis not present

## 2020-10-07 ENCOUNTER — Other Ambulatory Visit (HOSPITAL_COMMUNITY): Payer: Self-pay

## 2020-10-07 MED ORDER — ERGOCALCIFEROL 1.25 MG (50000 UT) PO CAPS
50000.0000 [IU] | ORAL_CAPSULE | ORAL | 0 refills | Status: DC
Start: 2020-10-07 — End: 2021-02-20
  Filled 2020-10-07 – 2020-10-10 (×2): qty 12, 84d supply, fill #0

## 2020-10-10 ENCOUNTER — Other Ambulatory Visit (HOSPITAL_BASED_OUTPATIENT_CLINIC_OR_DEPARTMENT_OTHER): Payer: Self-pay

## 2020-10-11 ENCOUNTER — Other Ambulatory Visit (HOSPITAL_COMMUNITY): Payer: Self-pay

## 2020-11-09 ENCOUNTER — Other Ambulatory Visit (HOSPITAL_COMMUNITY): Payer: Self-pay

## 2020-11-09 MED ORDER — ALPRAZOLAM 0.25 MG PO TABS
0.2500 mg | ORAL_TABLET | Freq: Two times a day (BID) | ORAL | 0 refills | Status: DC | PRN
  Filled 2020-11-09: qty 30, 15d supply, fill #0

## 2020-12-02 ENCOUNTER — Other Ambulatory Visit (HOSPITAL_COMMUNITY): Payer: Self-pay

## 2020-12-02 ENCOUNTER — Other Ambulatory Visit: Payer: Self-pay | Admitting: Cardiology

## 2020-12-02 MED ORDER — VERAPAMIL HCL ER 240 MG PO TBCR
240.0000 mg | EXTENDED_RELEASE_TABLET | Freq: Every day | ORAL | 1 refills | Status: DC
Start: 2020-12-02 — End: 2021-06-02
  Filled 2020-12-02: qty 90, 90d supply, fill #0
  Filled 2021-03-03: qty 90, 90d supply, fill #1

## 2020-12-15 ENCOUNTER — Other Ambulatory Visit (HOSPITAL_COMMUNITY): Payer: Self-pay

## 2020-12-15 MED ORDER — ALPRAZOLAM 0.25 MG PO TABS
0.2500 mg | ORAL_TABLET | Freq: Two times a day (BID) | ORAL | 0 refills | Status: AC | PRN
  Filled 2020-12-15: qty 30, 15d supply, fill #0

## 2021-01-02 ENCOUNTER — Other Ambulatory Visit (HOSPITAL_BASED_OUTPATIENT_CLINIC_OR_DEPARTMENT_OTHER): Payer: Self-pay

## 2021-01-02 MED ORDER — FLUARIX QUADRIVALENT 0.5 ML IM SUSY
PREFILLED_SYRINGE | INTRAMUSCULAR | 0 refills | Status: DC
Start: 2021-01-02 — End: 2021-06-19
  Filled 2021-01-02: qty 0.5, 1d supply, fill #0

## 2021-01-19 ENCOUNTER — Other Ambulatory Visit (HOSPITAL_BASED_OUTPATIENT_CLINIC_OR_DEPARTMENT_OTHER): Payer: Self-pay

## 2021-01-19 MED ORDER — ALPRAZOLAM 0.25 MG PO TABS
ORAL_TABLET | ORAL | 0 refills | Status: DC
  Filled 2021-01-19: qty 30, 15d supply, fill #0

## 2021-01-19 MED ORDER — CARESTART COVID-19 HOME TEST VI KIT
PACK | 0 refills | Status: DC
  Filled 2021-01-19: qty 4, 4d supply, fill #0

## 2021-01-20 ENCOUNTER — Other Ambulatory Visit (HOSPITAL_BASED_OUTPATIENT_CLINIC_OR_DEPARTMENT_OTHER): Payer: Self-pay

## 2021-01-26 ENCOUNTER — Other Ambulatory Visit (HOSPITAL_BASED_OUTPATIENT_CLINIC_OR_DEPARTMENT_OTHER): Payer: Self-pay

## 2021-02-16 DIAGNOSIS — E559 Vitamin D deficiency, unspecified: Secondary | ICD-10-CM | POA: Diagnosis not present

## 2021-02-17 ENCOUNTER — Other Ambulatory Visit (HOSPITAL_COMMUNITY): Payer: Self-pay

## 2021-02-17 MED ORDER — ERGOCALCIFEROL 1.25 MG (50000 UT) PO CAPS
50000.0000 [IU] | ORAL_CAPSULE | ORAL | 0 refills | Status: DC
  Filled 2021-02-17 – 2021-02-20 (×2): qty 24, 84d supply, fill #0

## 2021-02-20 ENCOUNTER — Other Ambulatory Visit (HOSPITAL_BASED_OUTPATIENT_CLINIC_OR_DEPARTMENT_OTHER): Payer: Self-pay

## 2021-02-20 ENCOUNTER — Other Ambulatory Visit (HOSPITAL_COMMUNITY): Payer: Self-pay

## 2021-03-03 ENCOUNTER — Other Ambulatory Visit (HOSPITAL_COMMUNITY): Payer: Self-pay

## 2021-03-03 ENCOUNTER — Other Ambulatory Visit (HOSPITAL_BASED_OUTPATIENT_CLINIC_OR_DEPARTMENT_OTHER): Payer: Self-pay

## 2021-03-03 MED ORDER — ALPRAZOLAM 0.25 MG PO TABS
0.2500 mg | ORAL_TABLET | Freq: Two times a day (BID) | ORAL | 0 refills | Status: DC | PRN
  Filled 2021-03-03: qty 30, 15d supply, fill #0

## 2021-04-06 ENCOUNTER — Other Ambulatory Visit (HOSPITAL_COMMUNITY): Payer: Self-pay

## 2021-04-06 MED ORDER — ALPRAZOLAM 0.25 MG PO TABS
0.2500 mg | ORAL_TABLET | Freq: Two times a day (BID) | ORAL | 0 refills | Status: DC | PRN
  Filled 2021-04-06: qty 30, 15d supply, fill #0

## 2021-05-23 ENCOUNTER — Other Ambulatory Visit (HOSPITAL_BASED_OUTPATIENT_CLINIC_OR_DEPARTMENT_OTHER): Payer: Self-pay

## 2021-05-23 MED ORDER — ALPRAZOLAM 0.25 MG PO TABS
ORAL_TABLET | ORAL | 0 refills | Status: DC
  Filled 2021-05-23: qty 30, 15d supply, fill #0

## 2021-06-02 ENCOUNTER — Other Ambulatory Visit (HOSPITAL_BASED_OUTPATIENT_CLINIC_OR_DEPARTMENT_OTHER): Payer: Self-pay

## 2021-06-02 ENCOUNTER — Other Ambulatory Visit: Payer: Self-pay | Admitting: Cardiology

## 2021-06-02 MED ORDER — VERAPAMIL HCL ER 240 MG PO TBCR
240.0000 mg | EXTENDED_RELEASE_TABLET | Freq: Every day | ORAL | 0 refills | Status: DC
  Filled 2021-06-02: qty 30, 30d supply, fill #0

## 2021-06-19 ENCOUNTER — Ambulatory Visit: Payer: 59 | Admitting: Student

## 2021-06-19 ENCOUNTER — Encounter: Payer: Self-pay | Admitting: Student

## 2021-06-19 ENCOUNTER — Ambulatory Visit: Payer: 59 | Admitting: Cardiology

## 2021-06-19 VITALS — BP 125/83 | HR 70 | Temp 98.3°F | Resp 16 | Ht 66.0 in | Wt 171.8 lb

## 2021-06-19 DIAGNOSIS — I493 Ventricular premature depolarization: Secondary | ICD-10-CM | POA: Diagnosis not present

## 2021-06-19 NOTE — Progress Notes (Signed)
? ? ?  Patient referred by Kelton Pillar, MD for PVC's ? ?Subjective:  ? ?Patricia Rivera, female    DOB: October 20, 1990, 31 y.o.   MRN: [id number] ? ?No chief complaint on file. ? ?HPI ? ?31 y.o. Caucasian female with symptomatic PAC/PVC's.  ? ?Patient was last seen in our office 08/26/2019 by Dr. Virgina Jock at which time palpitation symptoms were well controlled on verapamil he was advised to follow-up as needed.  Patient presents for follow-up at her request.  She states 2 weeks ago she had an episode of palpitations that were intermittent throughout 1 day, worse while at work for couple of hours.  Patient does admit that on this day she had not eaten and had drank a couple of cups of coffee.  Other than this episode symptoms have been very well controlled since last office visit. ? ?She remains fairly asymptomatic from a cardiovascular standpoint. ? ?Current Outpatient Medications on File Prior to Visit  ?Medication Sig Dispense Refill  ? acetaminophen (TYLENOL) 500 MG tablet Take 2 tablets (1,000 mg total) by mouth every 6 (six) hours. 60 tablet 0  ? ALPRAZolam (XANAX) 0.25 MG tablet Take 1 tablet (0.25 mg total) by mouth 2 (two) times daily as needed. 30 tablet 0  ? verapamil (CALAN-SR) 240 MG CR tablet Take 1 tablet (240 mg total) by mouth at bedtime. 30 tablet 0  ? ?No current facility-administered medications on file prior to visit.  ? ? ?Cardiovascular and other pertinent studies: ?EKG 06/19/2021:  ?Sinus rhythm at a rate of 81 bpm.  Normal axis.  Nonspecific T wave abnormality.  Unchanged compared to previous EKG 03/30/2019. ? ?Event monitor 04/02/2019 - 05/04/2019: ?Diagnostic time: 51%. This limits sensitivity of the study.  ?Dominant rhythm: Sinus. ?HR 63-174 bpm. Avg HR 89 bpm. ?Multiple PVC's, occasional PAC's. Most correlate with symptoms of flutter/skipped beats.  ?No atrial fibrillation/atrial flutter/SVT/VT/high grade AV block, sinus pause >3sec noted. ? ?Recent labs: ?02/27/2019: ?H/H 13.9/40.6. MCV 87.0.  Platelets 353 ? ? ?Review of Systems  ?Cardiovascular:  Positive for palpitations. Negative for chest pain, dyspnea on exertion, leg swelling and syncope.  ? ?   ? ? ?Vitals:  ? 06/19/21 1248  ?BP: 125/83  ?Pulse: 70  ?Resp: 16  ?Temp: 98.3 ?F (36.8 ?C)  ?SpO2: 100%  ? ? ? ? ?Objective:  ? Physical Exam ?Vitals reviewed.  ?Cardiovascular:  ?   Rate and Rhythm: Normal rate and regular rhythm.  ?   Pulses: Intact distal pulses.  ?   Heart sounds: S1 normal and S2 normal. No murmur heard. ?  No gallop.  ?Pulmonary:  ?   Effort: Pulmonary effort is normal. No respiratory distress.  ?   Breath sounds: No wheezing, rhonchi or rales.  ?Musculoskeletal:  ?   Right lower leg: No edema.  ?   Left lower leg: No edema.  ?Neurological:  ?   Mental Status: She is alert.  ?  ?   ? ?Assessment & Recommendations:  ? ?31 y.o. Caucasian female with symptomatic PAC/PVC's.  ? ?Symptomatic PVCs: ?Patient's symptoms have been well controlled over the last 2 years with verapamil, suspect episode 2 weeks ago was related to caffeine intake. ?Continue verapamil to 40 mg daily.  Reassured patient as EKG is unchanged compared to previous. ?We will continue to monitor, will patient notify our office if symptoms worsen or become more frequent. ? ?Follow-up as needed, defer further management to PCP. ? ?

## 2021-06-26 ENCOUNTER — Other Ambulatory Visit (HOSPITAL_BASED_OUTPATIENT_CLINIC_OR_DEPARTMENT_OTHER): Payer: Self-pay

## 2021-06-26 MED ORDER — VERAPAMIL HCL ER 240 MG PO TBCR
EXTENDED_RELEASE_TABLET | ORAL | 0 refills | Status: DC
  Filled 2021-06-26: qty 90, 90d supply, fill #0

## 2021-06-26 MED ORDER — ALPRAZOLAM 0.25 MG PO TABS
ORAL_TABLET | ORAL | 0 refills | Status: DC
  Filled 2021-06-26: qty 30, 15d supply, fill #0

## 2021-06-26 MED ORDER — VERAPAMIL HCL ER 240 MG PO CP24
ORAL_CAPSULE | ORAL | 3 refills | Status: DC
  Filled 2021-06-26: qty 90, 90d supply, fill #0

## 2021-07-27 DIAGNOSIS — Z6826 Body mass index (BMI) 26.0-26.9, adult: Secondary | ICD-10-CM | POA: Diagnosis not present

## 2021-07-27 DIAGNOSIS — Z124 Encounter for screening for malignant neoplasm of cervix: Secondary | ICD-10-CM | POA: Diagnosis not present

## 2021-07-27 DIAGNOSIS — E559 Vitamin D deficiency, unspecified: Secondary | ICD-10-CM | POA: Diagnosis not present

## 2021-07-27 DIAGNOSIS — Z01419 Encounter for gynecological examination (general) (routine) without abnormal findings: Secondary | ICD-10-CM | POA: Diagnosis not present

## 2021-07-28 ENCOUNTER — Other Ambulatory Visit (HOSPITAL_BASED_OUTPATIENT_CLINIC_OR_DEPARTMENT_OTHER): Payer: Self-pay

## 2021-07-28 MED ORDER — ERGOCALCIFEROL 1.25 MG (50000 UT) PO CAPS
ORAL_CAPSULE | ORAL | 0 refills | Status: DC
  Filled 2021-07-28 – 2021-08-09 (×2): qty 24, 84d supply, fill #0

## 2021-08-08 ENCOUNTER — Other Ambulatory Visit (HOSPITAL_BASED_OUTPATIENT_CLINIC_OR_DEPARTMENT_OTHER): Payer: Self-pay

## 2021-08-09 ENCOUNTER — Other Ambulatory Visit (HOSPITAL_BASED_OUTPATIENT_CLINIC_OR_DEPARTMENT_OTHER): Payer: Self-pay

## 2021-08-16 ENCOUNTER — Other Ambulatory Visit (HOSPITAL_COMMUNITY): Payer: Self-pay

## 2021-08-16 MED ORDER — ALPRAZOLAM 0.25 MG PO TABS
0.2500 mg | ORAL_TABLET | Freq: Two times a day (BID) | ORAL | 0 refills | Status: DC | PRN
Start: 2021-08-16 — End: 2023-07-19
  Filled 2021-08-16: qty 30, 15d supply, fill #0

## 2021-08-23 ENCOUNTER — Other Ambulatory Visit (HOSPITAL_COMMUNITY): Payer: Self-pay

## 2021-08-23 ENCOUNTER — Other Ambulatory Visit (HOSPITAL_BASED_OUTPATIENT_CLINIC_OR_DEPARTMENT_OTHER): Payer: Self-pay

## 2021-09-28 ENCOUNTER — Other Ambulatory Visit (HOSPITAL_BASED_OUTPATIENT_CLINIC_OR_DEPARTMENT_OTHER): Payer: Self-pay

## 2021-09-28 MED ORDER — ALPRAZOLAM 0.25 MG PO TABS
ORAL_TABLET | ORAL | 0 refills | Status: DC
  Filled 2021-09-28: qty 30, 15d supply, fill #0

## 2021-09-28 MED ORDER — VERAPAMIL HCL ER 240 MG PO TBCR
EXTENDED_RELEASE_TABLET | ORAL | 0 refills | Status: DC
  Filled 2021-09-28: qty 90, 90d supply, fill #0

## 2021-10-12 DIAGNOSIS — Z0000 Encounter for general adult medical examination without abnormal findings: Secondary | ICD-10-CM | POA: Diagnosis not present

## 2021-10-12 DIAGNOSIS — F411 Generalized anxiety disorder: Secondary | ICD-10-CM | POA: Diagnosis not present

## 2021-10-12 DIAGNOSIS — Z23 Encounter for immunization: Secondary | ICD-10-CM | POA: Diagnosis not present

## 2021-10-12 DIAGNOSIS — E559 Vitamin D deficiency, unspecified: Secondary | ICD-10-CM | POA: Diagnosis not present

## 2021-10-12 DIAGNOSIS — Z13220 Encounter for screening for lipoid disorders: Secondary | ICD-10-CM | POA: Diagnosis not present

## 2021-10-12 DIAGNOSIS — I493 Ventricular premature depolarization: Secondary | ICD-10-CM | POA: Diagnosis not present

## 2021-10-12 DIAGNOSIS — Z1159 Encounter for screening for other viral diseases: Secondary | ICD-10-CM | POA: Diagnosis not present

## 2021-11-28 ENCOUNTER — Other Ambulatory Visit (HOSPITAL_BASED_OUTPATIENT_CLINIC_OR_DEPARTMENT_OTHER): Payer: Self-pay

## 2021-11-28 MED ORDER — ALPRAZOLAM 0.25 MG PO TABS
0.2500 mg | ORAL_TABLET | Freq: Two times a day (BID) | ORAL | 0 refills | Status: DC | PRN
  Filled 2021-11-28: qty 30, 15d supply, fill #0

## 2021-12-27 ENCOUNTER — Other Ambulatory Visit (HOSPITAL_BASED_OUTPATIENT_CLINIC_OR_DEPARTMENT_OTHER): Payer: Self-pay

## 2021-12-27 MED ORDER — VERAPAMIL HCL ER 240 MG PO TBCR
240.0000 mg | EXTENDED_RELEASE_TABLET | Freq: Every day | ORAL | 1 refills | Status: AC
  Filled 2021-12-27: qty 80, 80d supply, fill #0
  Filled 2021-12-27: qty 10, 10d supply, fill #0

## 2022-01-10 ENCOUNTER — Other Ambulatory Visit (HOSPITAL_BASED_OUTPATIENT_CLINIC_OR_DEPARTMENT_OTHER): Payer: Self-pay

## 2022-01-10 MED ORDER — ALPRAZOLAM 0.25 MG PO TABS
0.2500 mg | ORAL_TABLET | Freq: Two times a day (BID) | ORAL | 0 refills | Status: DC | PRN
  Filled 2022-01-10: qty 30, 15d supply, fill #0

## 2022-03-08 ENCOUNTER — Other Ambulatory Visit (HOSPITAL_COMMUNITY): Payer: Self-pay

## 2022-03-08 DIAGNOSIS — J019 Acute sinusitis, unspecified: Secondary | ICD-10-CM | POA: Diagnosis not present

## 2022-03-08 DIAGNOSIS — I493 Ventricular premature depolarization: Secondary | ICD-10-CM | POA: Diagnosis not present

## 2022-03-08 MED ORDER — FLUTICASONE PROPIONATE 50 MCG/ACT NA SUSP
1.0000 | Freq: Every day | NASAL | 0 refills | Status: AC
  Filled 2022-03-08: qty 16, 60d supply, fill #0

## 2022-03-08 MED ORDER — SULFAMETHOXAZOLE-TRIMETHOPRIM 800-160 MG PO TABS
1.0000 | ORAL_TABLET | Freq: Two times a day (BID) | ORAL | 0 refills | Status: DC
  Filled 2022-03-08: qty 20, 10d supply, fill #0

## 2022-03-09 ENCOUNTER — Other Ambulatory Visit (HOSPITAL_COMMUNITY): Payer: Self-pay

## 2022-03-12 ENCOUNTER — Other Ambulatory Visit (HOSPITAL_COMMUNITY): Payer: Self-pay

## 2022-03-29 ENCOUNTER — Other Ambulatory Visit (HOSPITAL_BASED_OUTPATIENT_CLINIC_OR_DEPARTMENT_OTHER): Payer: Self-pay

## 2022-03-29 MED ORDER — ALPRAZOLAM 0.25 MG PO TABS
0.2500 mg | ORAL_TABLET | Freq: Two times a day (BID) | ORAL | 0 refills | Status: DC | PRN
  Filled 2022-03-29: qty 30, 15d supply, fill #0

## 2022-03-29 MED ORDER — VERAPAMIL HCL ER 240 MG PO TBCR
240.0000 mg | EXTENDED_RELEASE_TABLET | Freq: Every day | ORAL | 1 refills | Status: DC
  Filled 2022-03-29: qty 90, 90d supply, fill #0

## 2022-06-04 ENCOUNTER — Other Ambulatory Visit (HOSPITAL_BASED_OUTPATIENT_CLINIC_OR_DEPARTMENT_OTHER): Payer: Self-pay

## 2022-06-04 MED ORDER — ALPRAZOLAM 0.25 MG PO TABS
0.2500 mg | ORAL_TABLET | Freq: Two times a day (BID) | ORAL | 0 refills | Status: DC | PRN
  Filled 2022-06-04: qty 30, 15d supply, fill #0

## 2022-06-26 ENCOUNTER — Other Ambulatory Visit (HOSPITAL_BASED_OUTPATIENT_CLINIC_OR_DEPARTMENT_OTHER): Payer: Self-pay

## 2022-06-26 MED ORDER — VERAPAMIL HCL ER 240 MG PO TBCR
EXTENDED_RELEASE_TABLET | ORAL | 1 refills | Status: DC
  Filled 2022-06-26: qty 90, 90d supply, fill #0

## 2022-07-22 ENCOUNTER — Telehealth: Payer: Commercial Managed Care - PPO | Admitting: Family

## 2022-07-22 DIAGNOSIS — R197 Diarrhea, unspecified: Secondary | ICD-10-CM

## 2022-07-22 DIAGNOSIS — R1084 Generalized abdominal pain: Secondary | ICD-10-CM

## 2022-07-22 NOTE — Progress Notes (Signed)
Virtual Visit Consent   Patricia Rivera, you are scheduled for a virtual visit with a [facility] provider today. Just as with appointments in the office, your consent must be obtained to participate. Your consent will be active for this visit and any virtual visit you may have with one of our providers in the next 365 days. If you have a MyChart account, a copy of this consent can be sent to you electronically.  As this is a virtual visit, video technology does not allow for your provider to perform a traditional examination. This may limit your provider's ability to fully assess your condition. If your provider identifies any concerns that need to be evaluated in person or the need to arrange testing (such as labs, EKG, etc.), we will make arrangements to do so. Although advances in technology are sophisticated, we cannot ensure that it will always work on either your end or our end. If the connection with a video visit is poor, the visit may have to be switched to a telephone visit. With either a video or telephone visit, we are not always able to ensure that we have a secure connection.  By engaging in this virtual visit, you consent to the provision of healthcare and authorize for your insurance to be billed (if applicable) for the services provided during this visit. Depending on your insurance coverage, you may receive a charge related to this service.  I need to obtain your verbal consent now. Are you willing to proceed with your visit today? Patricia Rivera has provided verbal consent on 07/22/2022 for a virtual visit (video or telephone). Jannifer Rodney, FNP  Date: 07/22/2022 8:30 AM  Virtual Visit via Video Note   I, Jannifer Rodney, connected with  Patricia Rivera  ([id number], [birth year]) on 07/22/22 at  8:30 AM EDT by a video-enabled telemedicine application and verified that I am speaking with the correct person using two identifiers.  Location: Patient: Virtual Visit Location Patient:  Home Provider: Virtual Visit Location Provider: Home Office   I discussed the limitations of evaluation and management by telemedicine and the availability of in person appointments. The patient expressed understanding and agreed to proceed.    History of Present Illness: Patricia Rivera is a 33 y.o. who identifies as a female who was assigned female at birth, and is being seen today for abdominal pain that started yesterday with diarrhea.  HPI: Abdominal Pain This is a new problem. The current episode started yesterday. The problem has been waxing and waning. The pain is located in the LLQ. The pain is at a severity of 4/10. The quality of the pain is cramping. Associated symptoms include diarrhea, flatus, nausea and vomiting. Pertinent negatives include no fever. The treatment provided mild relief.    Problems:  Patient Active Problem List   Diagnosis Date Noted   Encounter for induction of labor 09/30/2019   VBAC, delivered 09/30/2019   Perineal laceration, second degree 09/30/2019   PAC (premature atrial contraction) 05/25/2019   Symptomatic PVCs 03/29/2019   Breech birth 05/16/2017   Postpartum care following VBAC 8/18 05/16/2017   Heart palpitations 06/18/2014   H/O chest pain    Childhood asthma     Allergies:  Allergies  Allergen Reactions   Amoxicillin Nausea Only and Nausea And Vomiting   Adhesive [Tape] Itching and Rash    EKG LEADS   Medications:  Current Outpatient Medications:    acetaminophen (TYLENOL) 500 MG tablet, Take 2 tablets (1,000 mg total)  by mouth every 6 (six) hours., Disp: 60 tablet, Rfl: 0   ALPRAZolam (XANAX) 0.25 MG tablet, Take 1 tablet (0.25 mg total) by mouth 2 (two) times daily as needed., Disp: 30 tablet, Rfl: 0   ALPRAZolam (XANAX) 0.25 MG tablet, Take 1 tablet by mouth Twice a day as needed, Disp: 30 tablet, Rfl: 0   ALPRAZolam (XANAX) 0.25 MG tablet, Take 1 tablet (0.25 mg total) by mouth 2 (two) times daily as needed., Disp: 30 tablet, Rfl:  0   ALPRAZolam (XANAX) 0.25 MG tablet, 1 tablet Orally Twice a day as needed, Disp: 30 tablet, Rfl: 0   ALPRAZolam (XANAX) 0.25 MG tablet, Take 1 tablet (0.25 mg total) by mouth 2 (two) times daily as needed., Disp: 30 tablet, Rfl: 0   ALPRAZolam (XANAX) 0.25 MG tablet, Take 1 tablet (0.25 mg total) by mouth 2 (two) times daily as needed., Disp: 30 tablet, Rfl: 0   ergocalciferol (VITAMIN D2) 1.25 MG (50000 UT) capsule, Take 1 capsule by mouth twice a week, Disp: 24 capsule, Rfl: 0   fluticasone (FLONASE ALLERGY RELIEF) 50 MCG/ACT nasal spray, Place 1 spray into both nostrils daily., Disp: 16 g, Rfl: 0   sulfamethoxazole-trimethoprim (BACTRIM DS) 800-160 MG tablet, Take 1 tablet by mouth every 12 (twelve) hours for 10 days, Disp: 20 tablet, Rfl: 0   verapamil (CALAN-SR) 240 MG CR tablet, Take 1 tablet by mouth Once a day., Disp: 90 tablet, Rfl: 1   verapamil (CALAN-SR) 240 MG CR tablet, Take 1 tablet (240 mg total) by mouth daily., Disp: 90 tablet, Rfl: 1   verapamil (CALAN-SR) 240 MG CR tablet, Take 1 tablet Orally Once a day 90 days, Disp: 90 tablet, Rfl: 1  Observations/Objective: Patient is well-developed, well-nourished in no acute distress.  Resting comfortably  at home.  Head is normocephalic, atraumatic.  No labored breathing.  Speech is clear and coherent with logical content.  Patient is alert and oriented at baseline.  No acute distress, no pain when pushing on abdomen  Assessment and Plan: 1. Generalized abdominal pain  2. Diarrhea, unspecified type  Recommend BRAT diet  Force fluids Imodium as needed If pain worsens, fever, or blood need to be seen today If continues tomorrow needs to be seen in person to rule out infection    Follow Up Instructions: I discussed the assessment and treatment plan with the patient. The patient was provided an opportunity to ask questions and all were answered. The patient agreed with the plan and demonstrated an understanding of the  instructions.  A copy of instructions were sent to the patient via MyChart unless otherwise noted below.     The patient was advised to call back or seek an in-person evaluation if the symptoms worsen or if the condition fails to improve as anticipated.  Time:  I spent 12 minutes with the patient via telehealth technology discussing the above problems/concerns.    Jannifer Rodney, FNP

## 2022-07-27 ENCOUNTER — Other Ambulatory Visit (HOSPITAL_BASED_OUTPATIENT_CLINIC_OR_DEPARTMENT_OTHER): Payer: Self-pay

## 2022-07-27 MED ORDER — ALPRAZOLAM 0.25 MG PO TABS
0.2500 mg | ORAL_TABLET | Freq: Two times a day (BID) | ORAL | 0 refills | Status: DC | PRN
  Filled 2022-07-27: qty 30, 15d supply, fill #0

## 2022-09-20 ENCOUNTER — Other Ambulatory Visit: Payer: Self-pay

## 2022-09-20 ENCOUNTER — Other Ambulatory Visit (HOSPITAL_BASED_OUTPATIENT_CLINIC_OR_DEPARTMENT_OTHER): Payer: Self-pay

## 2022-09-20 MED ORDER — VERAPAMIL HCL ER 240 MG PO TBCR
240.0000 mg | EXTENDED_RELEASE_TABLET | Freq: Every day | ORAL | 1 refills | Status: DC
  Filled 2022-09-20: qty 90, 90d supply, fill #0

## 2022-09-20 MED ORDER — ALPRAZOLAM 0.25 MG PO TABS
0.2500 mg | ORAL_TABLET | Freq: Two times a day (BID) | ORAL | 0 refills | Status: DC | PRN
  Filled 2022-09-20: qty 30, 15d supply, fill #0

## 2022-10-05 ENCOUNTER — Other Ambulatory Visit (HOSPITAL_BASED_OUTPATIENT_CLINIC_OR_DEPARTMENT_OTHER): Payer: Self-pay

## 2022-10-05 DIAGNOSIS — I493 Ventricular premature depolarization: Secondary | ICD-10-CM | POA: Diagnosis not present

## 2022-10-05 DIAGNOSIS — Z0000 Encounter for general adult medical examination without abnormal findings: Secondary | ICD-10-CM | POA: Diagnosis not present

## 2022-10-05 DIAGNOSIS — G44209 Tension-type headache, unspecified, not intractable: Secondary | ICD-10-CM | POA: Diagnosis not present

## 2022-10-05 DIAGNOSIS — R5383 Other fatigue: Secondary | ICD-10-CM | POA: Diagnosis not present

## 2022-10-05 DIAGNOSIS — E559 Vitamin D deficiency, unspecified: Secondary | ICD-10-CM | POA: Diagnosis not present

## 2022-10-05 DIAGNOSIS — F411 Generalized anxiety disorder: Secondary | ICD-10-CM | POA: Diagnosis not present

## 2022-10-05 MED ORDER — CYCLOBENZAPRINE HCL 5 MG PO TABS
5.0000 mg | ORAL_TABLET | Freq: Three times a day (TID) | ORAL | 0 refills | Status: AC | PRN
  Filled 2022-10-05: qty 30, 10d supply, fill #0

## 2022-11-05 ENCOUNTER — Other Ambulatory Visit (HOSPITAL_BASED_OUTPATIENT_CLINIC_OR_DEPARTMENT_OTHER): Payer: Self-pay

## 2022-11-05 MED ORDER — INFLUENZA VIRUS VACC SPLIT PF (FLUZONE) 0.5 ML IM SUSY
0.5000 mL | PREFILLED_SYRINGE | Freq: Once | INTRAMUSCULAR | 0 refills | Status: AC
  Filled 2022-11-05: qty 0.5, 1d supply, fill #0

## 2022-12-26 ENCOUNTER — Other Ambulatory Visit (HOSPITAL_BASED_OUTPATIENT_CLINIC_OR_DEPARTMENT_OTHER): Payer: Self-pay

## 2022-12-26 MED ORDER — ALPRAZOLAM 0.25 MG PO TABS
0.2500 mg | ORAL_TABLET | Freq: Two times a day (BID) | ORAL | 0 refills | Status: DC | PRN
  Filled 2022-12-26: qty 30, 15d supply, fill #0

## 2022-12-26 MED ORDER — VERAPAMIL HCL ER 240 MG PO TBCR
240.0000 mg | EXTENDED_RELEASE_TABLET | Freq: Every day | ORAL | 0 refills | Status: DC
  Filled 2022-12-26: qty 90, 90d supply, fill #0

## 2023-02-04 ENCOUNTER — Encounter: Payer: Self-pay | Admitting: Nurse Practitioner

## 2023-02-12 ENCOUNTER — Other Ambulatory Visit (HOSPITAL_BASED_OUTPATIENT_CLINIC_OR_DEPARTMENT_OTHER): Payer: Self-pay

## 2023-02-12 DIAGNOSIS — J988 Other specified respiratory disorders: Secondary | ICD-10-CM | POA: Diagnosis not present

## 2023-02-12 DIAGNOSIS — H6692 Otitis media, unspecified, left ear: Secondary | ICD-10-CM | POA: Diagnosis not present

## 2023-02-12 MED ORDER — DOXYCYCLINE MONOHYDRATE 100 MG PO CAPS
100.0000 mg | ORAL_CAPSULE | Freq: Two times a day (BID) | ORAL | 0 refills | Status: AC
  Filled 2023-02-12 (×2): qty 20, 10d supply, fill #0

## 2023-02-14 ENCOUNTER — Other Ambulatory Visit (HOSPITAL_COMMUNITY): Payer: Self-pay

## 2023-03-21 ENCOUNTER — Other Ambulatory Visit: Payer: Self-pay

## 2023-03-21 ENCOUNTER — Other Ambulatory Visit (HOSPITAL_BASED_OUTPATIENT_CLINIC_OR_DEPARTMENT_OTHER): Payer: Self-pay

## 2023-03-21 MED ORDER — ALPRAZOLAM 0.25 MG PO TABS
0.2500 mg | ORAL_TABLET | Freq: Two times a day (BID) | ORAL | 0 refills | Status: DC | PRN
  Filled 2023-03-21: qty 30, 15d supply, fill #0

## 2023-03-21 MED ORDER — VERAPAMIL HCL ER 240 MG PO TBCR
240.0000 mg | EXTENDED_RELEASE_TABLET | Freq: Every day | ORAL | 1 refills | Status: DC
  Filled 2023-03-21: qty 90, 90d supply, fill #0

## 2023-06-05 ENCOUNTER — Other Ambulatory Visit (HOSPITAL_BASED_OUTPATIENT_CLINIC_OR_DEPARTMENT_OTHER): Payer: Self-pay

## 2023-06-05 MED ORDER — CYCLOBENZAPRINE HCL 5 MG PO TABS
5.0000 mg | ORAL_TABLET | Freq: Three times a day (TID) | ORAL | 0 refills | Status: DC | PRN
Start: 2023-06-05 — End: 2023-07-19
  Filled 2023-06-05: qty 30, 5d supply, fill #0
  Filled 2023-06-24: qty 30, 10d supply, fill #0

## 2023-06-15 ENCOUNTER — Other Ambulatory Visit (HOSPITAL_BASED_OUTPATIENT_CLINIC_OR_DEPARTMENT_OTHER): Payer: Self-pay

## 2023-06-20 ENCOUNTER — Other Ambulatory Visit (HOSPITAL_BASED_OUTPATIENT_CLINIC_OR_DEPARTMENT_OTHER): Payer: Self-pay

## 2023-06-20 MED ORDER — VERAPAMIL HCL ER 240 MG PO TBCR
240.0000 mg | EXTENDED_RELEASE_TABLET | Freq: Every day | ORAL | 1 refills | Status: DC
  Filled 2023-06-20: qty 90, 90d supply, fill #0

## 2023-06-24 ENCOUNTER — Other Ambulatory Visit (HOSPITAL_BASED_OUTPATIENT_CLINIC_OR_DEPARTMENT_OTHER): Payer: Self-pay

## 2023-07-19 ENCOUNTER — Encounter: Payer: Self-pay | Admitting: Neurology

## 2023-07-19 ENCOUNTER — Ambulatory Visit: Payer: Self-pay | Admitting: Neurology

## 2023-07-19 ENCOUNTER — Other Ambulatory Visit (HOSPITAL_BASED_OUTPATIENT_CLINIC_OR_DEPARTMENT_OTHER): Payer: Self-pay

## 2023-07-19 VITALS — BP 133/89 | HR 93 | Resp 15 | Ht 66.0 in | Wt 172.0 lb

## 2023-07-19 DIAGNOSIS — H9311 Tinnitus, right ear: Secondary | ICD-10-CM | POA: Diagnosis not present

## 2023-07-19 DIAGNOSIS — R42 Dizziness and giddiness: Secondary | ICD-10-CM | POA: Diagnosis not present

## 2023-07-19 DIAGNOSIS — H9190 Unspecified hearing loss, unspecified ear: Secondary | ICD-10-CM

## 2023-07-19 DIAGNOSIS — R519 Headache, unspecified: Secondary | ICD-10-CM | POA: Diagnosis not present

## 2023-07-19 DIAGNOSIS — R51 Headache with orthostatic component, not elsewhere classified: Secondary | ICD-10-CM

## 2023-07-19 MED ORDER — RIZATRIPTAN BENZOATE 10 MG PO TBDP
10.0000 mg | ORAL_TABLET | ORAL | 11 refills | Status: AC | PRN
  Filled 2023-07-19: qty 12, 15d supply, fill #0
  Filled 2023-10-31: qty 12, 15d supply, fill #1
  Filled 2024-03-05: qty 12, 15d supply, fill #2

## 2023-07-19 NOTE — Patient Instructions (Addendum)
 MRI of the brain w/wo contrast Rizatriptan: At onset of migraines take triptan, can repeat in 2 hours if needed and can take with an allere or ibuprofen  and can repeat in 2 hours. Or during periods can take maxalt twice a day with ibuprofen  for example sinc eyou know that is a trigger. We have other triptans and also new medications such as ubrelvy/nurtec. Also in anticipation for menstrual migraines can take 2x a day in advance with nsaid.  Can only treat acutely or can also add preventative such as topamax/topiramate. We also have newer medication class ajovy/emgality/qulipta (cgrp) You can look up Occipital nerve if you want ot look it up the nerve at the back of the skull involved in migraines and occipital neuralgia since your headaches start at the back of the skull If triptan makes PVCs worse would go straight to the new medications Nurtec or Ubrelvy  Rizatriptan Disintegrating Tablets What is this medication? RIZATRIPTAN (rye za TRIP tan) treats migraines. It works by blocking pain signals and narrowing blood vessels in the brain. It belongs to a group of medications called triptans. It is not used to prevent migraines. This medicine may be used for other purposes; ask your health care provider or pharmacist if you have questions. COMMON BRAND NAME(S): Maxalt-MLT What should I tell my care team before I take this medication? They need to know if you have any of these conditions: Circulation problems in fingers and toes Diabetes Heart disease High blood pressure High cholesterol History of irregular heartbeat History of stroke Stomach or intestine problems Tobacco use An unusual or allergic reaction to rizatriptan, other medications, foods, dyes, or preservatives Pregnant or trying to get pregnant Breast-feeding How should I use this medication? Take this medication by mouth. Take it as directed on the prescription label. You do not need water to take this medication. Leave the  tablet in the sealed pack until you are ready to take it. With dry hands, open the pack and gently remove the tablet. If the tablet breaks or crumbles, throw it away. Use a new tablet. Place the tablet on the tongue and allow it to dissolve. Then, swallow it. Do not cut, crush, or chew this medication. Do not use it more often than directed. Talk to your care team about the use of this medication in children. While it may be prescribed for children as young as 6 years for selected conditions, precautions do apply. Overdosage: If you think you have taken too much of this medicine contact a poison control center or emergency room at once. NOTE: This medicine is only for you. Do not share this medicine with others. What if I miss a dose? This does not apply. This medication is not for regular use. What may interact with this medication? Do not take this medication with any of the following: Ergot alkaloids, such as dihydroergotamine, ergotamine MAOIs, such as Marplan, Nardil, Parnate Other medications for migraine headache, such as almotriptan, eletriptan, frovatriptan, naratriptan, sumatriptan, zolmitriptan This medication may also interact with the following: Certain medications for depression, anxiety, or other mental health conditions Propranolol This list may not describe all possible interactions. Give your health care provider a list of all the medicines, herbs, non-prescription drugs, or dietary supplements you use. Also tell them if you smoke, drink alcohol, or use illegal drugs. Some items may interact with your medicine. What should I watch for while using this medication? Visit your care team for regular checks on your progress. Tell your care team  if your symptoms do not start to get better or if they get worse. This medication may affect your coordination, reaction time, or judgment. Do not drive or operate machinery until you know how this medication affects you. Sit up or stand slowly  to reduce the risk of dizzy or fainting spells. If you take migraine medications for 10 or more days a month, your migraines may get worse. Keep a diary of headache days and medication use. Contact your care team if your migraine attacks occur more frequently. What side effects may I notice from receiving this medication? Side effects that you should report to your care team as soon as possible: Allergic reactions--skin rash, itching, hives, swelling of the face, lips, tongue, or throat Burning, pain, tingling, or color changes in the hands, arms, legs, or feet Heart attack--pain or tightness in the chest, shoulders, arms, or jaw, nausea, shortness of breath, cold or clammy skin, feeling faint or lightheaded Heart rhythm changes--fast or irregular heartbeat, dizziness, feeling faint or lightheaded, chest pain, trouble breathing Increase in blood pressure Irritability, confusion, fast or irregular heartbeat, muscle stiffness, twitching muscles, sweating, high fever, seizure, chills, vomiting, diarrhea, which may be signs of serotonin syndrome Raynaud syndrome--cool, numb, or painful fingers or toes that may change color from pale, to blue, to red Seizures Stroke--sudden numbness or weakness of the face, arm, or leg, trouble speaking, confusion, trouble walking, loss of balance or coordination, dizziness, severe headache, change in vision Sudden or severe stomach pain, bloody diarrhea, fever, nausea, vomiting Vision loss Side effects that usually do not require medical attention (report to your care team if they continue or are bothersome): Dizziness Unusual weakness or fatigue This list may not describe all possible side effects. Call your doctor for medical advice about side effects. You may report side effects to FDA at 1-800-FDA-1088. Where should I keep my medication? Keep out of the reach of children and pets. Store at room temperature between 15 and 30 degrees C (59 and 86 degrees F).  Protect from light and moisture. Get rid of any unused medication after the expiration date. To get rid of medications that are no longer needed or have expired: Take the medication to a medication take-back program. Check with your pharmacy or law enforcement to find a location. If you cannot return the medication, check the label or package insert to see if the medication should be thrown out in the garbage or flushed down the toilet. If you are not sure, ask your care team. If it is safe to put it in the trash, empty the medication out of the container. Mix the medication with cat litter, dirt, coffee grounds, or other unwanted substance. Seal the mixture in a bag or container. Put it in the trash. NOTE: This sheet is a summary. It may not cover all possible information. If you have questions about this medicine, talk to your doctor, pharmacist, or health care provider.  2024 Elsevier/Gold Standard (2021-06-01 00:00:00)

## 2023-07-19 NOTE — Progress Notes (Signed)
 [facility]    Provider:  Dr Tresia Fruit Requesting Provider: Benedetto Brady, MD Primary Care Provider:  Benedetto Brady, MD  CC:  headaches. headaches: starts at skull base and radiates up right side to forehead and behind the right eye.   HPI:  Patricia Rivera is a 33 y.o. female here as requested by Benedetto Brady, MD for headache/migraine.   Ongoing 3 yrs, intermittent, varying intensity. Mainly affected by period cycles. Occasion ear pain that's sharp and has tinnitus and also has neck pain. Started 3 years ago used to be guaranteed with cycle but now worsening in freq and severity and she can tell when it is coming on at the base of her skull, throbbing, right side, if she doesn't take medicine it radiates to the right back of the eye, affecting her work and family life, now also medications not working, she was put on a muscle relaxer which seemed to help and that stopped working. 6 weeks ago had the most severe episode, periodically she has a sharp stabbing pain in the ear on te right and ringing in the right ear nor pulsating consistent ringing, right sided, 6 weeks ago she had the ringing, sharp pain in the right ear, whole right side of the head hurting for 3 day, pulsing/throbbing, light sensitivity, sound sensitivity, nausea, hurts to move or bend over so positionally worse, can travel worse when bending over, radiating neck pain, for days and neck got very stiff. Last 3 months she has it with her cycles and maybe more 5-6 days a month maximum on average and < 6 total headache days a month. Sister has migraines. Her right eye gets blurry vision changes. Stafford Eagles is her sister who also has migraines per patient.no snoring or excessive daytime somnolence, dizziness.   Reviewed notes, labs and imaging from outside physicians, which showed:  Reviewed labs cbc,cmo,tsh nml  From a thorough review of records and patient report, Medications tried that can be used in  migraine/headache management greater than 3 months include: Lifestyle modification, headache diaries, better sleep hygiene, exercise, management of migraine triggers, OTC and prescribed analgesics/nsaids such as ibuprofen , excedrin, alleve and others.Verapamil . Metoprolol. Amitriptyline(after pregnancy)  Reviewed Dr. Baldomero Bone referral notes, from October 05, 2022, she reported pain in the posterior head that will sometimes radiate to the right forehead, help with NSAIDs, helped also with pressure in the posterior base of the skull, patient does work as a Sales executive and looking down most of the day, occurring maybe once or twice a month, his symptoms feel some throbbing in the neck the night before, no tinnitus with the pain.  Labs taken October 05, 2022 showed BUN 15 and creatinine 0.87 normal as stated above and TSH was 1.78 normal as stated above.  Review of Systems: Patient complains of symptoms per HPI as well as the following symptoms neck muscle pain. Pertinent negatives and positives per HPI. All others negative.   Social History   Socioeconomic History   Marital status: Married    Spouse name: Not on file   Number of children: 2   Years of education: Not on file   Highest education level: Not on file  Occupational History   Occupation: dental asst  Tobacco Use   Smoking status: Never    Passive exposure: Yes   Smokeless tobacco: Never  Vaping Use   Vaping status: Former  Substance and Sexual Activity   Alcohol use: No   Drug use: No   Sexual activity: Yes  Birth control/protection: Pill  Other Topics Concern   Not on file  Social History Narrative   Not on file   Social Drivers of Health   Financial Resource Strain: Not on file  Food Insecurity: Not on file  Transportation Needs: Not on file  Physical Activity: Not on file  Stress: Not on file  Social Connections: Unknown (06/18/2021)   Received from [facility]   Social Network    Social  Network: Not on file  Intimate Partner Violence: Unknown (05/18/2021)   Received from [facility]   HITS    Physically Hurt: Not on file    Insult or Talk Down To: Not on file    Threaten Physical Harm: Not on file    Scream or Curse: Not on file    Family History  Problem Relation Age of Onset   Heart attack Mother 73   Osteoporosis Mother    Hypertension Mother    Thyroid  disease Mother    Diabetes Maternal Grandmother    Hypertension Maternal Grandmother    Atrial fibrillation Maternal Grandmother    Cerebrovascular Accident Maternal Grandfather    Cancer Maternal Grandfather     Past Medical History:  Diagnosis Date   Childhood asthma    last inhaler use 2019   Dysrhythmia    PACs and PVCs take vamprimil for it   H/O chest pain    Migraine    Postpartum care following cesarean delivery Indication: Breech (4/4) 05/16/2017   PVC's (premature ventricular contractions)     Patient Active Problem List   Diagnosis Date Noted   Encounter for induction of labor 09/30/2019   VBAC, delivered 09/30/2019   Perineal laceration, second degree 09/30/2019   PAC (premature atrial contraction) 05/25/2019   Symptomatic PVCs 03/29/2019   Breech birth 05/16/2017   Postpartum care following VBAC 8/18 05/16/2017   Heart palpitations 06/18/2014   H/O chest pain    Childhood asthma     Past Surgical History:  Procedure Laterality Date   CESAREAN SECTION N/A 05/16/2017   Procedure: Primary CESAREAN SECTION;  Surgeon: Meriam Stamp, MD;  Location: [facility];  Service: Obstetrics;  Laterality: N/A;  EDD: 05/23/17   INDUCED ABORTION     by dilation and evacuation   LYMPHADENECTOMY     OTHER SURGICAL HISTORY  2010   lymph node removal    Current Outpatient Medications  Medication Sig Dispense Refill   acetaminophen  (TYLENOL ) 500 MG tablet Take 2 tablets (1,000 mg total) by mouth every 6 (six) hours. 60 tablet 0   ALPRAZolam  (XANAX ) 0.25 MG tablet Take 1  tablet (0.25 mg total) by mouth 2 (two) times daily as needed. 30 tablet 0   cyclobenzaprine  (FLEXERIL ) 5 MG tablet 1-2 tablets up to 3 times daily as needed. Orally 30 tablet 0   fluticasone  (FLONASE  ALLERGY RELIEF) 50 MCG/ACT nasal spray Place 1 spray into both nostrils daily. 16 g 0   rizatriptan (MAXALT-MLT) 10 MG disintegrating tablet Take 1 tablet (10 mg total) by mouth as needed for migraine. May repeat in 2 hours if needed 12 tablet 11   verapamil  (CALAN -SR) 240 MG CR tablet Take 1 tablet by mouth Once a day. 90 tablet 1   No current facility-administered medications for this visit.    Allergies as of 07/19/2023 - Review Complete 07/19/2023  Allergen Reaction Noted   Amoxicillin Nausea Only and Nausea And Vomiting 01/21/2018   Adhesive [tape] Itching and Rash 01/21/2018    Vitals:  BP 133/89   Pulse 93   Resp 15   Ht 5\' 6"  (1.676 m)   Wt 172 lb (78 kg)   SpO2 98%   BMI 27.76 kg/m  Last Weight:  Wt Readings from Last 1 Encounters:  07/19/23 172 lb (78 kg)   Last Height:   Ht Readings from Last 1 Encounters:  07/19/23 5\' 6"  (1.676 m)     Physical exam: Exam: Gen: NAD, conversant, well nourised, obese, well groomed                     CV: RRR, no MRG. No Carotid Bruits. No peripheral edema, warm, nontender Eyes: Conjunctivae clear without exudates or hemorrhage  Neuro: Detailed Neurologic Exam  Speech:    Speech is normal; fluent and spontaneous with normal comprehension.  Cognition:    The patient is oriented to person, place, and time;     recent and remote memory intact;     language fluent;     normal attention, concentration,     fund of knowledge Cranial Nerves:    The pupils are equal, round, and reactive to light. The fundi are normal and spontaneous venous pulsations are present. Visual fields are full to finger confrontation. Extraocular movements are intact. Trigeminal sensation is intact and the muscles of mastication are normal. The face is  symmetric. The palate elevates in the midline. Hearing intact. Voice is normal. Shoulder shrug is normal. The tongue has normal motion without fasciculations.   Coordination: nml  Gait: nml  Motor Observation:    No asymmetry, no atrophy, and no involuntary movements noted. Tone:    Normal muscle tone.    Posture:    Posture is normal. normal erect    Strength:    Strength is V/V in the upper and lower limbs.      Sensation: intact to LT     Reflex Exam:  DTR's:    Deep tendon reflexes in the upper and lower extremities are normal bilaterally.   Toes:    The toes are downgoing bilaterally.   Clonus:    Clonus is absent.    Assessment/Plan:  Patient with migraines, menstrually related. Also spent time education of migraines and migraine management, medication choices, acute vs prevention, strategies  MRI of the brain w/wo contrast IAC protocol: MRI brain due to concerning symptoms of morning headaches, positional headaches, worsening headaches freq and severity, tinnitus and ear pain  to look for space occupying mass, chiari or intracranial hypertension (pseudotumor), strokes, malignancies, vasculidities, demyelination(multiple sclerosis) or other Rizatriptan: At onset of migraines take triptan, can repeat in 2 hours if needed and can take with an alleve or ibuprofen  and can repeat in 2 hours. Or during periods can take maxalt twice a day with ibuprofen  for example since you know that is a trigger. We have other triptans and also new medications such as ubrelvy/nurtec. Also in anticipation for menstrual migraines can take 2x a day in advance with nsaid.  Can only treat acutely or can also add preventative such as topamax/topiramate. We also have newer medication class ajovy/emgality/qulipta (cgrp) You can look up Occipital nerve if you want ot look it up the nerve at the back of the skull involved in migraines and occipital neuralgia since your headaches start at the back of the  skull If triptan makes PVCs worse would go straight to the new medications Nurtec or Ubrelvy  Orders Placed This Encounter  Procedures   MR BRAIN W WO CONTRAST  Meds ordered this encounter  Medications   rizatriptan (MAXALT-MLT) 10 MG disintegrating tablet    Sig: Take 1 tablet (10 mg total) by mouth as needed for migraine. May repeat in 2 hours if needed    Dispense:  12 tablet    Refill:  11   To prevent or relieve headaches, try the following: Cool Compress. Lie down and place a cool compress on your head.  Avoid headache triggers. If certain foods or odors seem to have triggered your migraines in the past, avoid them. A headache diary might help you identify triggers.  Include physical activity in your daily routine. Try a daily walk or other moderate aerobic exercise.  Manage stress. Find healthy ways to cope with the stressors, such as delegating tasks on your to-do list.  Practice relaxation techniques. Try deep breathing, yoga, massage and visualization.  Eat regularly. Eating regularly scheduled meals and maintaining a healthy diet might help prevent headaches. Also, drink plenty of fluids.  Follow a regular sleep schedule. Sleep deprivation might contribute to headaches Consider biofeedback. With this mind-body technique, you learn to control certain bodily functions -- such as muscle tension, heart rate and blood pressure -- to prevent headaches or reduce headache pain.    Proceed to emergency room if you experience new or worsening symptoms or symptoms do not resolve, if you have new neurologic symptoms or if headache is severe, or for any concerning symptom.   Provided education and documentation from American headache Society toolbox including articles on: chronic migraine medication overuse headache, chronic migraines, prevention of migraines, behavioral and other nonpharmacologic treatments for headache.   Cc: Benedetto Brady, MD,  Benedetto Brady, MD  Aldona Amel,  MD  [facility] [address] [place], [place] [address]  Phone [phone] Fax [phone]  I spent 60 minutes of face-to-face and non-face-to-face time with patient on the  1. Worsening headaches   2. Positional headache   3. Subjective hearing change   4. Tinnitus of right ear   5. Vertigo    diagnosis.  This included previsit chart review, lab review, study review, order entry, electronic health record documentation, patient education on the different diagnostic and therapeutic options, counseling and coordination of care, risks and benefits of management, compliance, or risk factor reduction

## 2023-07-24 ENCOUNTER — Other Ambulatory Visit (HOSPITAL_BASED_OUTPATIENT_CLINIC_OR_DEPARTMENT_OTHER): Payer: Self-pay

## 2023-07-30 ENCOUNTER — Other Ambulatory Visit: Payer: Self-pay

## 2023-07-30 ENCOUNTER — Other Ambulatory Visit (HOSPITAL_BASED_OUTPATIENT_CLINIC_OR_DEPARTMENT_OTHER): Payer: Self-pay

## 2023-07-30 ENCOUNTER — Ambulatory Visit (INDEPENDENT_AMBULATORY_CARE_PROVIDER_SITE_OTHER)

## 2023-07-30 ENCOUNTER — Ambulatory Visit: Payer: Self-pay | Admitting: Neurology

## 2023-07-30 DIAGNOSIS — H9311 Tinnitus, right ear: Secondary | ICD-10-CM

## 2023-07-30 DIAGNOSIS — R519 Headache, unspecified: Secondary | ICD-10-CM

## 2023-07-30 DIAGNOSIS — R42 Dizziness and giddiness: Secondary | ICD-10-CM

## 2023-07-30 DIAGNOSIS — R51 Headache with orthostatic component, not elsewhere classified: Secondary | ICD-10-CM

## 2023-07-30 DIAGNOSIS — H9190 Unspecified hearing loss, unspecified ear: Secondary | ICD-10-CM | POA: Diagnosis not present

## 2023-07-30 MED ORDER — GADOBENATE DIMEGLUMINE 529 MG/ML IV SOLN
15.0000 mL | Freq: Once | INTRAVENOUS | Status: AC | PRN
  Administered 2023-07-30: 15 mL via INTRAVENOUS

## 2023-07-30 MED ORDER — ALPRAZOLAM 0.25 MG PO TABS
0.2500 mg | ORAL_TABLET | Freq: Two times a day (BID) | ORAL | 0 refills | Status: DC | PRN
  Filled 2023-07-30 – 2023-08-21 (×2): qty 30, 15d supply, fill #0

## 2023-08-09 ENCOUNTER — Other Ambulatory Visit (HOSPITAL_BASED_OUTPATIENT_CLINIC_OR_DEPARTMENT_OTHER): Payer: Self-pay

## 2023-08-21 ENCOUNTER — Other Ambulatory Visit (HOSPITAL_BASED_OUTPATIENT_CLINIC_OR_DEPARTMENT_OTHER): Payer: Self-pay

## 2023-09-18 ENCOUNTER — Other Ambulatory Visit (HOSPITAL_BASED_OUTPATIENT_CLINIC_OR_DEPARTMENT_OTHER): Payer: Self-pay

## 2023-09-18 MED ORDER — VERAPAMIL HCL ER 240 MG PO TBCR
240.0000 mg | EXTENDED_RELEASE_TABLET | Freq: Every day | ORAL | 1 refills | Status: AC
  Filled 2023-09-18: qty 90, 90d supply, fill #0
  Filled 2024-03-05: qty 90, 90d supply, fill #1

## 2023-10-08 DIAGNOSIS — E559 Vitamin D deficiency, unspecified: Secondary | ICD-10-CM | POA: Diagnosis not present

## 2023-10-08 DIAGNOSIS — Z13220 Encounter for screening for lipoid disorders: Secondary | ICD-10-CM | POA: Diagnosis not present

## 2023-10-08 DIAGNOSIS — I493 Ventricular premature depolarization: Secondary | ICD-10-CM | POA: Diagnosis not present

## 2023-10-11 DIAGNOSIS — F411 Generalized anxiety disorder: Secondary | ICD-10-CM | POA: Diagnosis not present

## 2023-10-11 DIAGNOSIS — Z13220 Encounter for screening for lipoid disorders: Secondary | ICD-10-CM | POA: Diagnosis not present

## 2023-10-11 DIAGNOSIS — I493 Ventricular premature depolarization: Secondary | ICD-10-CM | POA: Diagnosis not present

## 2023-10-11 DIAGNOSIS — E559 Vitamin D deficiency, unspecified: Secondary | ICD-10-CM | POA: Diagnosis not present

## 2023-10-11 DIAGNOSIS — G43909 Migraine, unspecified, not intractable, without status migrainosus: Secondary | ICD-10-CM | POA: Diagnosis not present

## 2023-10-11 DIAGNOSIS — G44209 Tension-type headache, unspecified, not intractable: Secondary | ICD-10-CM | POA: Diagnosis not present

## 2023-10-11 DIAGNOSIS — Z0189 Encounter for other specified special examinations: Secondary | ICD-10-CM | POA: Diagnosis not present

## 2023-10-31 ENCOUNTER — Other Ambulatory Visit (HOSPITAL_BASED_OUTPATIENT_CLINIC_OR_DEPARTMENT_OTHER): Payer: Self-pay

## 2023-10-31 MED ORDER — ALPRAZOLAM 0.25 MG PO TABS
0.2500 mg | ORAL_TABLET | Freq: Two times a day (BID) | ORAL | 0 refills | Status: DC | PRN
  Filled 2023-10-31: qty 30, 15d supply, fill #0

## 2023-10-31 MED ORDER — CYCLOBENZAPRINE HCL 5 MG PO TABS
5.0000 mg | ORAL_TABLET | Freq: Three times a day (TID) | ORAL | 0 refills | Status: AC | PRN
  Filled 2023-10-31: qty 30, 10d supply, fill #0

## 2023-11-01 ENCOUNTER — Other Ambulatory Visit (HOSPITAL_BASED_OUTPATIENT_CLINIC_OR_DEPARTMENT_OTHER): Payer: Self-pay

## 2023-11-06 ENCOUNTER — Other Ambulatory Visit (HOSPITAL_BASED_OUTPATIENT_CLINIC_OR_DEPARTMENT_OTHER): Payer: Self-pay

## 2023-11-06 DIAGNOSIS — M25561 Pain in right knee: Secondary | ICD-10-CM | POA: Diagnosis not present

## 2023-11-06 MED ORDER — MELOXICAM 15 MG PO TABS
15.0000 mg | ORAL_TABLET | Freq: Every day | ORAL | 1 refills | Status: AC | PRN
  Filled 2023-11-06: qty 30, 30d supply, fill #0

## 2023-11-25 ENCOUNTER — Other Ambulatory Visit (HOSPITAL_BASED_OUTPATIENT_CLINIC_OR_DEPARTMENT_OTHER): Payer: Self-pay

## 2023-11-25 ENCOUNTER — Other Ambulatory Visit (HOSPITAL_COMMUNITY): Payer: Self-pay

## 2023-11-25 DIAGNOSIS — B3731 Acute candidiasis of vulva and vagina: Secondary | ICD-10-CM | POA: Diagnosis not present

## 2023-11-26 ENCOUNTER — Other Ambulatory Visit (HOSPITAL_BASED_OUTPATIENT_CLINIC_OR_DEPARTMENT_OTHER): Payer: Self-pay

## 2023-12-04 DIAGNOSIS — K5900 Constipation, unspecified: Secondary | ICD-10-CM | POA: Diagnosis not present

## 2023-12-04 DIAGNOSIS — B379 Candidiasis, unspecified: Secondary | ICD-10-CM | POA: Diagnosis not present

## 2023-12-05 DIAGNOSIS — M25561 Pain in right knee: Secondary | ICD-10-CM | POA: Diagnosis not present

## 2023-12-09 ENCOUNTER — Telehealth: Admitting: Neurology

## 2023-12-12 ENCOUNTER — Other Ambulatory Visit: Payer: Self-pay

## 2023-12-12 ENCOUNTER — Other Ambulatory Visit (HOSPITAL_BASED_OUTPATIENT_CLINIC_OR_DEPARTMENT_OTHER): Payer: Self-pay

## 2023-12-12 MED ORDER — VERAPAMIL HCL ER 240 MG PO TBCR
240.0000 mg | EXTENDED_RELEASE_TABLET | Freq: Every day | ORAL | 2 refills | Status: AC
  Filled 2023-12-12 (×2): qty 90, 90d supply, fill #0

## 2023-12-12 MED ORDER — ALPRAZOLAM 0.25 MG PO TABS
0.2500 mg | ORAL_TABLET | Freq: Two times a day (BID) | ORAL | 0 refills | Status: AC | PRN
  Filled 2023-12-12 (×2): qty 30, 15d supply, fill #0

## 2023-12-13 DIAGNOSIS — M2241 Chondromalacia patellae, right knee: Secondary | ICD-10-CM | POA: Diagnosis not present

## 2023-12-16 ENCOUNTER — Ambulatory Visit: Attending: Cardiology | Admitting: Cardiology

## 2023-12-16 ENCOUNTER — Other Ambulatory Visit (HOSPITAL_COMMUNITY): Payer: Self-pay

## 2023-12-16 ENCOUNTER — Encounter: Payer: Self-pay | Admitting: Cardiology

## 2023-12-16 VITALS — BP 128/82 | HR 80 | Ht 66.0 in | Wt 170.8 lb

## 2023-12-16 DIAGNOSIS — R002 Palpitations: Secondary | ICD-10-CM | POA: Diagnosis not present

## 2023-12-16 DIAGNOSIS — I491 Atrial premature depolarization: Secondary | ICD-10-CM | POA: Diagnosis not present

## 2023-12-16 DIAGNOSIS — Z8249 Family history of ischemic heart disease and other diseases of the circulatory system: Secondary | ICD-10-CM | POA: Insufficient documentation

## 2023-12-16 DIAGNOSIS — I493 Ventricular premature depolarization: Secondary | ICD-10-CM

## 2023-12-16 MED ORDER — DILTIAZEM HCL 30 MG PO TABS
30.0000 mg | ORAL_TABLET | Freq: Three times a day (TID) | ORAL | 3 refills | Status: AC | PRN
  Filled 2023-12-16: qty 90, 30d supply, fill #0

## 2023-12-16 NOTE — Patient Instructions (Signed)
 Medication Instructions:  START Diltiazem 30 mg three times a day as needed   *If you need a refill on your cardiac medications before your next appointment, please call your pharmacy*  Testing/Procedures: EXERCISE STRESS TEST    Exercise Tolerance Test  Please arrive 15 minutes prior to your appointment time for registration and insurance purposes.  The test will take approximately 45 minutes to complete.  How to prepare for your Exercise Stress Test: Do bring a list of your current medications with you.  If not listed below, you may take your medications as normal. Do wear comfortable clothes (no dresses or overalls) and walking shoes, tennis shoes preferred (no heels or open toed shoes are allowed) Do Not wear cologne, perfume, aftershave or lotions (deodorant is allowed).   If these instructions are not followed, your test will have to be rescheduled.  If you have questions or concerns about your appointment, you can call the Stress Lab at [phone].  If you cannot keep your appointment, please provide 24 hours notification to the Stress Lab, to avoid a possible $50 charge to your account.   Follow-Up: At [facility], you and your health needs are our priority.  As part of our continuing mission to provide you with exceptional heart care, our providers are all part of one team.  This team includes your primary Cardiologist (physician) and Advanced Practice Providers or APPs (Physician Assistants and Nurse Practitioners) who all work together to provide you with the care you need, when you need it.  Your next appointment:   6 month(s)  Provider:   Newman JINNY Lawrence, MD

## 2023-12-16 NOTE — Progress Notes (Signed)
 Cardiology Office Note:  .   Date:  12/16/2023  ID:  Patricia Rivera, DOB 09-Feb-1991, MRN [id number] PCP: Leonel Cole, MD  [facility] Providers Cardiologist:  Newman Lawrence, MD PCP: Leonel Cole, MD  Chief Complaint  Patient presents with   Palpitations     Patricia Rivera is a 33 y.o. female with symptomatic PAC/PVC's  Discussed the use of AI scribe software for clinical note transcription with the patient, who gave verbal consent to proceed.  History of Present Illness   Last the patient in 2021 during her pregnancy when she had had complaints of palpitations and was found to have PACs and PVCs on monitor.  Patient has been on verapamil  240 mg daily since then.  Recently, she has had episodes of longer lasting palpitation episodes that have occurred roughly 4 times in last 6 months with lightheadedness, but no syncope, chest pain or shortness of breath.  She has had separate episode of chest tightness with certain movements, but not with physical exertion.  She has noticed significant constipation while on verapamil .  She has a family history of early coronary artery disease with her mother having had MI in her 27s.  She has history of atrial fibrillation in her grandparents.    Vitals:   12/16/23 0859  BP: 128/82  Pulse: 80  SpO2: 99%      Review of Systems  Cardiovascular:  Positive for palpitations. Negative for chest pain, dyspnea on exertion, leg swelling and syncope.        Studies Reviewed: SABRA        EKG 12/16/2023: Normal sinus rhythm Normal ECG When compared with ECG of 21-Jan-2018 22:52, No significant change was found     Event monitor 04/02/2019 - 05/04/2019: Diagnostic time: 51%. This limits sensitivity of the study.  Dominant rhythm: Sinus. HR 63-174 bpm. Avg HR 89 bpm. Multiple PVC's, occasional PAC's. Most correlate with symptoms of flutter/skipped beats.  No atrial fibrillation/atrial flutter/SVT/VT/high grade AV block, sinus  pause >3sec noted.    Labs 09/2023: Chol 166, TG 85, HDL 64, LDL 86 Hb 13.5 TSH 1.7   Physical Exam Vitals and nursing note reviewed.  Constitutional:      General: She is not in acute distress. Neck:     Vascular: No JVD.  Cardiovascular:     Rate and Rhythm: Normal rate and regular rhythm.     Heart sounds: Normal heart sounds. No murmur heard. Pulmonary:     Effort: Pulmonary effort is normal.     Breath sounds: Normal breath sounds. No wheezing or rales.  Musculoskeletal:     Right lower leg: No edema.     Left lower leg: No edema.      VISIT DIAGNOSES:   ICD-10-CM   1. Heart palpitations  R00.2 EKG 12-Lead    EXERCISE TOLERANCE TEST (ETT)    Cardiac Stress Test: Informed Consent Details: Physician/Practitioner Attestation; Transcribe to consent form and obtain patient signature    2. Symptomatic PVCs  I49.3     3. PAC (premature atrial contraction)  I49.1     4. Family history of early CAD  Z57.49        Patricia Rivera is a 33 y.o. female with symptomatic PAC/PVC's.   Assessment & Plan  Palpitations: Previously correlated with symptomatic PACs and PVCs.  By her description of her symptoms, reentrant arrhythmia cannot be excluded.  She has a structurally normal heart on echocardiogram in 2016.  I discussed placing her on  30-day event monitor.  However, she would like to avoid monitor if possible, as previously she has had reaction to the piece of strips.  We discussed vagal maneuvers.  We discussed alternate option to verapamil  due to constipation.  Diltiazem or metoprolol could have less constipation risk.  In the past, she had had no significant improvement in symptoms with metoprolol.  Given that she recently refilled her verapamil  prescription, she would like to continue the same.  I prescribed diltiazem 30 mg 3 times daily as needed for as needed use.  We discussed limiting caffeine intake, increasing hydration.  In addition, we will perform exercise  treadmill stress test as an indirect way to look for any arrhythmia, as we are trying to avoid monitor, especially as possible.  Family history of early CAD: Discussed heart healthy diet and lifestyle.  She would like to hold off calcium score scan at this time.   Informed Consent   Shared Decision Making/Informed Consent The risks [chest pain, shortness of breath, cardiac arrhythmias, dizziness, blood pressure fluctuations, myocardial infarction, stroke/transient ischemic attack, nausea, vomiting, allergic reaction, radiation exposure, metallic taste sensation and life-threatening complications (estimated to be 1 in 10,000)], benefits (risk stratification, diagnosing coronary artery disease, treatment guidance) and alternatives of a nuclear stress test were discussed in detail with Ms. Winstead and she agrees to proceed.       Meds ordered this encounter  Medications   diltiazem (CARDIZEM) 30 MG tablet    Sig: Take 1 tablet (30 mg total) by mouth 3 (three) times daily as needed (PALPITATIONS).    Dispense:  90 tablet    Refill:  3     F/u in 6 months  Signed, Newman JINNY Lawrence, MD

## 2024-01-02 ENCOUNTER — Telehealth: Payer: Self-pay | Admitting: Cardiology

## 2024-01-02 NOTE — Telephone Encounter (Signed)
 Patient is calling with questions in regards to her stress test. Please advise

## 2024-01-03 NOTE — Telephone Encounter (Signed)
 Left message to call back.

## 2024-01-03 NOTE — Telephone Encounter (Signed)
 Spoke with patient and shared response from Dr. [place]:  Exercise treadmill stress test would be preferred as workup for arrhythmia, patient has allergy to monitor adhesive. We can hold off stress test for now. After knee injury is healed and f patient is still having palpitations, we can then do the exercise treadmill stress test.   Thanks MJP      Patient verbalized understanding, requested to cancel stress test scheduled for 12/1 and will call if still having palpitations after knee is healed.

## 2024-01-03 NOTE — Telephone Encounter (Signed)
 Patient is returning call. Please advise?

## 2024-01-03 NOTE — Telephone Encounter (Signed)
 Pt can not do GXT due to recovering from knee injury. Do you want to change this to a Lexiscan?

## 2024-01-03 NOTE — Telephone Encounter (Signed)
 Exercise treadmill stress test would be preferred as workup for arrhythmia, patient has allergy to monitor adhesive. We can hold off stress test for now. After knee injury is healed and f patient is still having palpitations, we can then do the exercise treadmill stress test.  Thanks MJP

## 2024-01-13 ENCOUNTER — Ambulatory Visit (HOSPITAL_COMMUNITY)

## 2024-01-20 ENCOUNTER — Other Ambulatory Visit (HOSPITAL_BASED_OUTPATIENT_CLINIC_OR_DEPARTMENT_OTHER): Payer: Self-pay

## 2024-01-20 MED ORDER — FLUZONE 0.5 ML IM SUSY
0.5000 mL | PREFILLED_SYRINGE | Freq: Once | INTRAMUSCULAR | 0 refills | Status: AC
  Filled 2024-01-20: qty 0.5, 1d supply, fill #0

## 2024-01-31 ENCOUNTER — Encounter (HOSPITAL_COMMUNITY)

## 2024-02-10 ENCOUNTER — Other Ambulatory Visit (HOSPITAL_BASED_OUTPATIENT_CLINIC_OR_DEPARTMENT_OTHER): Payer: Self-pay

## 2024-02-10 DIAGNOSIS — R350 Frequency of micturition: Secondary | ICD-10-CM | POA: Diagnosis not present

## 2024-02-10 DIAGNOSIS — R300 Dysuria: Secondary | ICD-10-CM | POA: Diagnosis not present

## 2024-02-10 MED ORDER — NITROFURANTOIN MONOHYD MACRO 100 MG PO CAPS
100.0000 mg | ORAL_CAPSULE | Freq: Two times a day (BID) | ORAL | 0 refills | Status: AC
  Filled 2024-02-10: qty 10, 5d supply, fill #0

## 2024-02-11 ENCOUNTER — Other Ambulatory Visit (HOSPITAL_BASED_OUTPATIENT_CLINIC_OR_DEPARTMENT_OTHER): Payer: Self-pay

## 2024-02-11 MED ORDER — ALPRAZOLAM 0.25 MG PO TABS
0.2500 mg | ORAL_TABLET | Freq: Two times a day (BID) | ORAL | 0 refills | Status: AC
  Filled 2024-02-11: qty 30, 15d supply, fill #0

## 2024-03-05 ENCOUNTER — Other Ambulatory Visit (HOSPITAL_BASED_OUTPATIENT_CLINIC_OR_DEPARTMENT_OTHER): Payer: Self-pay

## 2024-03-10 ENCOUNTER — Telehealth: Payer: Self-pay | Admitting: Neurology

## 2024-03-10 NOTE — Telephone Encounter (Signed)
 Patient reschedule appointment due to scheduling conflict with school system, kid out of school due to bad weather.

## 2024-03-16 ENCOUNTER — Ambulatory Visit: Admitting: Neurology

## 2024-09-24 ENCOUNTER — Ambulatory Visit: Admitting: Neurology

## 2024-10-08 ENCOUNTER — Ambulatory Visit: Admitting: Neurology
